# Patient Record
Sex: Female | Born: 1970
Health system: Southern US, Community
[De-identification: ages and names within clinical notes are randomized; demographics above are authoritative.]

## PROBLEM LIST (undated history)

## (undated) DIAGNOSIS — E785 Hyperlipidemia, unspecified: Secondary | ICD-10-CM

## (undated) DIAGNOSIS — F419 Anxiety disorder, unspecified: Secondary | ICD-10-CM

## (undated) DIAGNOSIS — D649 Anemia, unspecified: Secondary | ICD-10-CM

## (undated) DIAGNOSIS — T7840XA Allergy, unspecified, initial encounter: Secondary | ICD-10-CM

## (undated) HISTORY — DX: Anemia, unspecified: D64.9

## (undated) HISTORY — PX: BRAIN SURGERY: SHX531

## (undated) HISTORY — DX: Allergy, unspecified, initial encounter: T78.40XA

## (undated) HISTORY — DX: Hyperlipidemia, unspecified: E78.5

## (undated) HISTORY — PX: COLONOSCOPY: SHX174

## (undated) HISTORY — DX: Anxiety disorder, unspecified: F41.9

## (undated) HISTORY — PX: DILATION AND CURETTAGE OF UTERUS: SHX78

---

## 2014-02-07 LAB — HM COLONOSCOPY

## 2015-02-16 ENCOUNTER — Ambulatory Visit (INDEPENDENT_AMBULATORY_CARE_PROVIDER_SITE_OTHER): Payer: BLUE CROSS/BLUE SHIELD | Admitting: Podiatry

## 2015-02-16 VITALS — BP 149/91 | HR 78 | Resp 16

## 2015-02-16 DIAGNOSIS — L600 Ingrowing nail: Secondary | ICD-10-CM

## 2015-02-16 DIAGNOSIS — M79671 Pain in right foot: Secondary | ICD-10-CM | POA: Diagnosis not present

## 2015-02-16 DIAGNOSIS — L603 Nail dystrophy: Secondary | ICD-10-CM

## 2015-02-16 NOTE — Progress Notes (Signed)
   Subjective:    Patient ID: Madison Esparza, female    DOB: [birth year], 44 y.o.   MRN: [id number]  HPI  Pt presents with nail fungal infection of 1st and 4th toes right foot, possible ingrown lateral nail on 1st right hallux  Review of Systems  Psychiatric/Behavioral: The patient is nervous/anxious.   All other systems reviewed and are negative.      Objective:   Physical Exam        Assessment & Plan:

## 2015-02-16 NOTE — Patient Instructions (Addendum)

## 2015-02-19 NOTE — Progress Notes (Signed)
Subjective:     Patient ID: Madison Esparza, female   DOB: [birth year], 44 y.o.   MRN: [id number]  HPI patient presents with a painful right hallux nail lateral border that makes it hard for him to wear shoe gear comfortably and also thickness and yellow numbness of the nailbed itself   Review of Systems  All other systems reviewed and are negative.      Objective:   Physical Exam  Constitutional: She is oriented to person, place, and time.  Cardiovascular: Intact distal pulses.   Musculoskeletal: Normal range of motion.  Neurological: She is oriented to person, place, and time.  Skin: Skin is warm.  Nursing note and vitals reviewed.  neurovascular status found to be intact with muscle strength adequate range of motion within normal limits. Patient's found to have incurvated right hallux lateral border with pain and pressure and inability to wear shoe gear comfortably and yellowness of the nailbed itself with most likely trauma being a part of the condition. Good digital perfusion noted well oriented 3     Assessment:      ingrown toenail deformity right hallux with mycotic nail infection also noted    Plan:      H&P and condition reviewed with patient. At this point I recommended removal of the nail corner and a permanent fashion due to deformity and pain and patient wants procedure understanding risk. I allowed patient to read consent form reviewing condition and she's comfortable with this and I then went ahead infiltrated 60 Milligan times like Marcaine mixture applied sterile prep and then remove the lateral border exposing matrix and applying phenol 3 applications 30 seconds followed by alcohol lavage and sterile dressings. Gave instructions on soaks and reappoint and also we discussed possibilities for laser o pulse Lamisil therapy for the nail infection that the patient has but we will try topical first. Reappoint 4 months

## 2015-02-20 ENCOUNTER — Telehealth: Payer: Self-pay | Admitting: *Deleted

## 2015-02-20 NOTE — Telephone Encounter (Signed)
Left message at [phone] (Cell #) for patient to call me back regarding their ingrown toenail procedure that was performed on Friday, September, 9, 2016. Waiting for a response.

## 2015-03-09 ENCOUNTER — Ambulatory Visit (INDEPENDENT_AMBULATORY_CARE_PROVIDER_SITE_OTHER): Payer: BLUE CROSS/BLUE SHIELD | Admitting: Podiatry

## 2015-03-09 ENCOUNTER — Encounter: Payer: Self-pay | Admitting: Podiatry

## 2015-03-09 VITALS — BP 137/92 | HR 94 | Resp 16

## 2015-03-09 DIAGNOSIS — L600 Ingrowing nail: Secondary | ICD-10-CM | POA: Diagnosis not present

## 2015-03-09 DIAGNOSIS — Z9889 Other specified postprocedural states: Secondary | ICD-10-CM

## 2015-03-09 DIAGNOSIS — B351 Tinea unguium: Secondary | ICD-10-CM

## 2015-03-09 MED ORDER — TERBINAFINE HCL 250 MG PO TABS: 250.0000 mg | ORAL_TABLET | Freq: Every day | ORAL | Status: DC

## 2015-03-09 NOTE — Progress Notes (Signed)
Subjective:     Patient ID: Madison Esparza, female   DOB: 1970-07-08, 44 y.o.   MRN: [id number]  HPI patient states my nail feels much better but it is still very yellow and I like to know what we can do   Review of Systems     Objective:   Physical Exam Neurovascular status intact muscle strength adequate with thick yellow brittle nail right hallux second and fifth nailbeds    Assessment:     Doing well from ingrown toenail with mycotic nail infection    Plan:     Reviewed condition and discussed that I want to get it better it will most likely require combined treatment with no long-term guarantees. Patient wants to go this route and at this time I went ahead and start the patient on Lamisil 250 mg daily for 90 days and we will reevaluate her liver function that was just done and we will begin laser treatment of the nailbeds. Reappoint for laser

## 2015-03-12 ENCOUNTER — Telehealth: Payer: Self-pay | Admitting: *Deleted

## 2015-03-12 MED ORDER — TERBINAFINE HCL 250 MG PO TABS: 250.0000 mg | ORAL_TABLET | Freq: Every day | ORAL | Status: DC

## 2015-03-12 NOTE — Telephone Encounter (Signed)
Informed pt, she could pick up her Terbinafine without waiting for prior authorization if she allowed me to call the rx to a [facility].  Pt agreed order changed to [employer].

## 2015-03-23 ENCOUNTER — Telehealth: Payer: Self-pay | Admitting: *Deleted

## 2015-03-23 NOTE — Telephone Encounter (Signed)
Dr. Charlsie Merlesegal states Hepatic function labs were within normal limits, take medication as directed.  Left message informing pt of orders.

## 2015-03-26 ENCOUNTER — Ambulatory Visit: Payer: BLUE CROSS/BLUE SHIELD | Admitting: Podiatry

## 2015-04-04 ENCOUNTER — Encounter: Payer: Self-pay | Admitting: Podiatry

## 2015-04-04 ENCOUNTER — Ambulatory Visit (INDEPENDENT_AMBULATORY_CARE_PROVIDER_SITE_OTHER): Payer: BLUE CROSS/BLUE SHIELD | Admitting: Podiatry

## 2015-04-04 DIAGNOSIS — B351 Tinea unguium: Secondary | ICD-10-CM

## 2015-04-04 NOTE — Progress Notes (Signed)
Subjective:     Patient ID: Madison Esparza, female   DOB: 11/23/1970, 44 y.o.   MRN: [id number]  HPI patient presents for laser of the fourth fifth and first nail right   Review of Systems     Objective:   Physical Exam Neurovascular status is intact and patient is doing well with taking her Lamisil currently with no systemic effect and has nail disease of these 3 nails    Assessment:     Mycotic infection 3 nails right    Plan:     Laser administered today tolerated well and reappoint in 6 weeks to really do laser

## 2015-05-21 ENCOUNTER — Ambulatory Visit (INDEPENDENT_AMBULATORY_CARE_PROVIDER_SITE_OTHER): Payer: BLUE CROSS/BLUE SHIELD | Admitting: Podiatry

## 2015-05-21 DIAGNOSIS — B351 Tinea unguium: Secondary | ICD-10-CM

## 2015-05-22 NOTE — Progress Notes (Signed)
Subjective:     Patient ID: Madison DuboisMaria Lease, female   DOB: 12/08/1970, 44 y.o.   MRN: [id number]  HPI patient states it appears to be improving   Review of Systems     Objective:   Physical Exam Nail disease which is improving with utilization of laser therapy    Assessment:     Pulse laser therapy administered    Plan:     Tolerated well with approximate 1000 pulses and will reappoint 4 months for reevaluation

## 2015-08-27 ENCOUNTER — Ambulatory Visit: Payer: Managed Care, Other (non HMO)

## 2015-08-27 DIAGNOSIS — B351 Tinea unguium: Secondary | ICD-10-CM

## 2015-08-27 NOTE — Progress Notes (Addendum)
Subjective:     Patient ID: Madison DuboisMaria Esparza, female   DOB: 08/20/1970, 45 y.o.   MRN: [id number]  HPI patient states it appears to be improving   Review of Systems     Objective:   Physical Exam Nail disease which is improving with utilization of laser therapy    Assessment:     Pulse laser therapy administered    Plan:     Tolerated well with approximate 1000 pulses all safety precautions in place . Nails 95% clear. Advised to continue oral medication until complete, follow up if symptoms change.

## 2015-09-24 ENCOUNTER — Other Ambulatory Visit: Payer: BLUE CROSS/BLUE SHIELD

## 2015-09-27 ENCOUNTER — Other Ambulatory Visit: Payer: BLUE CROSS/BLUE SHIELD

## 2017-01-28 ENCOUNTER — Other Ambulatory Visit: Payer: Self-pay | Admitting: Obstetrics and Gynecology

## 2017-01-28 ENCOUNTER — Other Ambulatory Visit (HOSPITAL_COMMUNITY)
Admission: RE | Admit: 2017-01-28 | Discharge: 2017-01-28 | Disposition: A | Discharge status: Home / Self Care | Payer: Managed Care, Other (non HMO) | Source: Ambulatory Visit | Attending: Obstetrics and Gynecology | Admitting: Obstetrics and Gynecology

## 2017-01-28 DIAGNOSIS — Z124 Encounter for screening for malignant neoplasm of cervix: Secondary | ICD-10-CM | POA: Diagnosis not present

## 2017-01-28 DIAGNOSIS — Z1231 Encounter for screening mammogram for malignant neoplasm of breast: Secondary | ICD-10-CM

## 2017-01-30 LAB — CYTOLOGY - PAP
Diagnosis: NEGATIVE
HPV: NOT DETECTED

## 2017-02-10 ENCOUNTER — Ambulatory Visit: Payer: Self-pay

## 2017-02-13 ENCOUNTER — Ambulatory Visit: Payer: Managed Care, Other (non HMO)

## 2017-02-18 ENCOUNTER — Ambulatory Visit
Admission: RE | Admit: 2017-02-18 | Discharge: 2017-02-18 | Disposition: A | Discharge status: Home / Self Care | Payer: Managed Care, Other (non HMO) | Source: Ambulatory Visit | Attending: Obstetrics and Gynecology | Admitting: Obstetrics and Gynecology

## 2017-02-18 DIAGNOSIS — Z1231 Encounter for screening mammogram for malignant neoplasm of breast: Secondary | ICD-10-CM

## 2017-03-10 ENCOUNTER — Other Ambulatory Visit: Payer: Self-pay | Admitting: Obstetrics and Gynecology

## 2018-02-03 ENCOUNTER — Other Ambulatory Visit: Payer: Self-pay | Admitting: Obstetrics and Gynecology

## 2018-02-03 DIAGNOSIS — Z1231 Encounter for screening mammogram for malignant neoplasm of breast: Secondary | ICD-10-CM

## 2018-03-03 ENCOUNTER — Ambulatory Visit
Admission: RE | Admit: 2018-03-03 | Discharge: 2018-03-03 | Disposition: A | Discharge status: Home / Self Care | Payer: Managed Care, Other (non HMO) | Source: Ambulatory Visit | Attending: Obstetrics and Gynecology | Admitting: Obstetrics and Gynecology

## 2018-03-03 DIAGNOSIS — Z1231 Encounter for screening mammogram for malignant neoplasm of breast: Secondary | ICD-10-CM

## 2018-06-30 ENCOUNTER — Encounter: Payer: Self-pay | Admitting: Family Medicine

## 2018-06-30 ENCOUNTER — Ambulatory Visit (INDEPENDENT_AMBULATORY_CARE_PROVIDER_SITE_OTHER): Payer: Managed Care, Other (non HMO) | Admitting: Family Medicine

## 2018-06-30 VITALS — BP 110/80 | HR 74 | Temp 98.2°F | Ht 60.0 in | Wt 154.4 lb

## 2018-06-30 DIAGNOSIS — E785 Hyperlipidemia, unspecified: Secondary | ICD-10-CM | POA: Insufficient documentation

## 2018-06-30 DIAGNOSIS — Z0000 Encounter for general adult medical examination without abnormal findings: Secondary | ICD-10-CM | POA: Diagnosis not present

## 2018-06-30 DIAGNOSIS — Z23 Encounter for immunization: Secondary | ICD-10-CM | POA: Diagnosis not present

## 2018-06-30 DIAGNOSIS — Z114 Encounter for screening for human immunodeficiency virus [HIV]: Secondary | ICD-10-CM | POA: Diagnosis not present

## 2018-06-30 DIAGNOSIS — E782 Mixed hyperlipidemia: Secondary | ICD-10-CM | POA: Insufficient documentation

## 2018-06-30 LAB — COMPREHENSIVE METABOLIC PANEL
ALT: 22 U/L (ref 0–35)
AST: 25 U/L (ref 0–37)
Albumin: 4.6 g/dL (ref 3.5–5.2)
Alkaline Phosphatase: 49 U/L (ref 39–117)
BUN: 13 mg/dL (ref 6–23)
CO2: 30 mEq/L (ref 19–32)
Calcium: 10.4 mg/dL (ref 8.4–10.5)
Chloride: 99 mEq/L (ref 96–112)
Creatinine, Ser: 0.62 mg/dL (ref 0.40–1.20)
GFR: 103.57 mL/min (ref >60.00)
Glucose, Bld: 85 mg/dL (ref 70–99)
Potassium: 4.3 mEq/L (ref 3.5–5.1)
Sodium: 137 mEq/L (ref 135–145)
Total Bilirubin: 0.5 mg/dL (ref 0.2–1.2)
Total Protein: 7.6 g/dL (ref 6.0–8.3)

## 2018-06-30 LAB — CBC
HCT: 36.1 % (ref 36.0–46.0)
Hemoglobin: 11.7 g/dL — ABNORMAL LOW (ref 12.0–15.0)
MCHC: 32.4 g/dL (ref 30.0–36.0)
MCV: 73.1 fl — ABNORMAL LOW (ref 78.0–100.0)
Platelets: 343 10*3/uL (ref 150.0–400.0)
RBC: 4.95 Mil/uL (ref 3.87–5.11)
RDW: 15.4 % (ref 11.5–15.5)
WBC: 7.2 10*3/uL (ref 4.0–10.5)

## 2018-06-30 LAB — LIPID PANEL
Cholesterol: 166 mg/dL (ref 0–200)
HDL: 67 mg/dL (ref >39.00)
LDL Cholesterol: 82 mg/dL (ref 0–99)
NonHDL: 99.05
Total CHOL/HDL Ratio: 2
Triglycerides: 86 mg/dL (ref 0.0–149.0)
VLDL: 17.2 mg/dL (ref 0.0–40.0)

## 2018-06-30 LAB — TSH: TSH: 1.12 u[IU]/mL (ref 0.35–4.50)

## 2018-06-30 NOTE — Progress Notes (Signed)
Patient: Madison Esparza MRN: [id number] DOB: 11/03/1970 PCP: Orland Mustard, MD     Subjective:  Chief Complaint  Patient presents with  . Establish Care  . Annual Exam    HPI: The patient is a 48 y.o. female who presents today for annual exam. She denies any changes to past medical history. There have been no recent hospitalizations. They are following a well balanced diet and exercise plan. Weight has been stable. No complaints today. No hx of colon or breast cancer in family.   Hyperlipidemia: She is currently on crestor. She was diagnosed maybe 10 years ago. Mother has hyperlipidemia. Sister had MI at age 46 and a brother with heart disease. She was tested with stress test/etc. She has no problems with the medication. Former smoker, stopped in 2006. No hx of HTN/diabetes.   There is no immunization history for the selected administration types on file for this patient.  Colonoscopy: not until 51.  Mammogram: 02/2017 Pap smear: 01/2017. Followed by Dr. Richardson Dopp  Tdap: can not recall.  Hiv: today   Review of Systems  Constitutional: Negative for chills, fatigue and fever.  HENT: Negative for dental problem, ear pain, hearing loss and trouble swallowing.   Eyes: Negative for visual disturbance.  Respiratory: Negative for cough, chest tightness and shortness of breath.   Cardiovascular: Negative for chest pain, palpitations and leg swelling.  Gastrointestinal: Negative for abdominal pain, blood in stool, diarrhea and nausea.  Endocrine: Negative for cold intolerance, polydipsia, polyphagia and polyuria.  Genitourinary: Negative for dysuria and hematuria.  Musculoskeletal: Negative for arthralgias, back pain and neck pain.  Skin: Negative.  Negative for rash.  Neurological: Negative for dizziness and headaches.  Psychiatric/Behavioral: Positive for sleep disturbance. Negative for dysphoric mood. The patient is not nervous/anxious.     Allergies Patient is allergic to motrin  [ibuprofen].  Past Medical History Patient  has a past medical history of Anxiety and Hyperlipidemia.  Surgical History Patient  has no past surgical history on file.  Family History Pateint's family history includes Breast cancer in her cousin.  Social History Patient  reports that she has quit smoking. She has never used smokeless tobacco.    Objective: Vitals:   06/30/18 1312  BP: 110/80  Pulse: 74  Temp: 98.2 F (36.8 C)  TempSrc: Oral  SpO2: 97%  Weight: 154 lb 6.4 oz (70 kg)  Height: 5' (1.524 m)    Body mass index is 30.15 kg/m.  Physical Exam Vitals signs reviewed.  Constitutional:      Appearance: She is well-developed.  HENT:     Right Ear: Tympanic membrane, ear canal and external ear normal.     Left Ear: Tympanic membrane, ear canal and external ear normal.  Eyes:     Conjunctiva/sclera: Conjunctivae normal.     Pupils: Pupils are equal, round, and reactive to light.  Neck:     Musculoskeletal: Normal range of motion and neck supple.     Thyroid: No thyromegaly.  Cardiovascular:     Rate and Rhythm: Normal rate and regular rhythm.     Heart sounds: Normal heart sounds. No murmur.  Pulmonary:     Effort: Pulmonary effort is normal.     Breath sounds: Normal breath sounds.  Abdominal:     General: Bowel sounds are normal. There is no distension.     Palpations: Abdomen is soft.     Tenderness: There is no abdominal tenderness.  Lymphadenopathy:     Cervical: No cervical adenopathy.  Skin:  General: Skin is warm and dry.     Findings: No rash.  Neurological:     Mental Status: She is alert and oriented to person, place, and time.     Cranial Nerves: No cranial nerve deficit.     Coordination: Coordination normal.     Deep Tendon Reflexes: Reflexes normal.  Psychiatric:        Behavior: Behavior normal.    Depression screen [facility] 2/9 06/30/2018  Decreased Interest 0  Down, Depressed, Hopeless 0  PHQ - 2 Score 0   GAD 7 : Generalized  Anxiety Score 06/30/2018  Nervous, Anxious, on Edge 1  Control/stop worrying 1  Worry too much - different things 1  Trouble relaxing 1  Restless 0  Easily annoyed or irritable 3  Afraid - awful might happen 1  Total GAD 7 Score 8  Anxiety Difficulty Not difficult at all        Assessment/plan: 1. Annual physical exam Doing well. Checking labs today and tdap/ utd on her pap/mmg. Declines flu. Will see her back in one year or as needed.  Patient counseling [x]    Nutrition: Stressed importance of moderation in sodium/caffeine intake, saturated fat and cholesterol, caloric balance, sufficient intake of fresh fruits, vegetables, fiber, calcium, iron, and 1 mg of folate supplement per day (for females capable of pregnancy).  [x]    Stressed the importance of regular exercise.   []    Substance Abuse: Discussed cessation/primary prevention of tobacco, alcohol, or other drug use; driving or other dangerous activities under the influence; availability of treatment for abuse.   [x]    Injury prevention: Discussed safety belts, safety helmets, smoke detector, smoking near bedding or upholstery.   [x]    Sexuality: Discussed sexually transmitted diseases, partner selection, use of condoms, avoidance of unintended pregnancy  and contraceptive alternatives.  [x]    Dental health: Discussed importance of regular tooth brushing, flossing, and dental visits.  [x]    Health maintenance and immunizations reviewed. Please refer to Health maintenance section.    - Comprehensive metabolic panel - CBC - TSH  2. Mixed hyperlipidemia Checking labs today. No refills needed. Tolerating medication well. Early MI in sister at age 79, but was smoker on OCP. She was stressed with negative work up at that time. Continue medication.  - Lipid panel  3. Need for Tdap vaccination  - Tdap vaccine greater than or equal to 7yo IM  4. Encounter for screening for HIV  - HIV Antibody (routine testing w  rflx)      Return in about 1 year (around 07/01/2019).     Orland Mustard, MD [place] [facility]  06/30/2018

## 2018-06-30 NOTE — Patient Instructions (Signed)
ashwagandha is great for sleep. 300mg  at night. Can get off [facility] or at [employer]

## 2018-07-01 LAB — HIV ANTIBODY (ROUTINE TESTING W REFLEX): HIV 1&2 Ab, 4th Generation: NONREACTIVE

## 2018-07-02 ENCOUNTER — Other Ambulatory Visit: Payer: Self-pay | Admitting: Family Medicine

## 2018-07-02 DIAGNOSIS — D508 Other iron deficiency anemias: Secondary | ICD-10-CM

## 2018-07-05 ENCOUNTER — Other Ambulatory Visit: Payer: Self-pay | Admitting: Family Medicine

## 2018-07-05 DIAGNOSIS — D509 Iron deficiency anemia, unspecified: Secondary | ICD-10-CM | POA: Insufficient documentation

## 2018-07-05 MED ORDER — FLUTICASONE PROPIONATE 50 MCG/ACT NA SUSP: 1.0000 | Freq: Every day | NASAL | 3 refills | Status: DC

## 2018-08-16 ENCOUNTER — Other Ambulatory Visit: Payer: Self-pay

## 2018-08-16 ENCOUNTER — Ambulatory Visit: Payer: Managed Care, Other (non HMO) | Attending: Orthopedic Surgery

## 2018-08-16 DIAGNOSIS — M79644 Pain in right finger(s): Secondary | ICD-10-CM | POA: Diagnosis present

## 2018-08-16 DIAGNOSIS — M25521 Pain in right elbow: Secondary | ICD-10-CM | POA: Diagnosis not present

## 2018-08-16 DIAGNOSIS — G8929 Other chronic pain: Secondary | ICD-10-CM | POA: Diagnosis present

## 2018-08-16 NOTE — Therapy (Signed)
[facility] [address] [place], [place], [address] Phone: [phone]   Fax:  [phone]  Physical Therapy Evaluation  Patient Details  Name: Madison Esparza MRN: [id number] Date of Birth: 09-06-1970 Referring Provider (PT): Frederico Hamman, MD   Encounter Date: 08/16/2018  PT End of Session - 08/16/18 0920    Visit Number  1    Number of Visits  12    Date for PT Re-Evaluation  09/24/18    Authorization Type  [employer]    PT Start Time  0920    PT Stop Time  1000    PT Time Calculation (min)  40 min    Activity Tolerance  Patient tolerated treatment well    Behavior During Therapy  [facility] for tasks assessed/performed       Past Medical History:  Diagnosis Date  . Allergy   . Anxiety   . Hyperlipidemia     No past surgical history on file.  There were no vitals filed for this visit.   Subjective Assessment - 08/16/18 0924    Subjective  RT elbow pain.  She has had this in past. Cortisone injection in past helped but this time wrist and thumb  pain is present and  work as Producer, television/film/video causes incr pain.   Trying to avoid another injection.  Prednisone has helped.  Continues with pain.     Pertinent History  Gym exercise.  Bicep curls , shoulder press, TRX rows   3-4 years  of elbow pain    Limitations  --   work  tasks , gym lifting dumb bells up to 20 pounds      Diagnostic tests  Xrays : negative    Patient Stated Goals  She wants to decr pain.     Currently in Pain?  Yes    Pain Score  2     Pain Location  Elbow    Pain Orientation  Right;Lateral    Pain Descriptors / Indicators  Throbbing    Pain Type  Chronic pain    Pain Onset  More than a month ago    Pain Frequency  Constant    Aggravating Factors   gripping , work , gym     Pain Relieving Factors  ice , cream.     Multiple Pain Sites  Yes    Pain Score  1    Pain Location  --   thumb    Pain Orientation  Right    Pain Descriptors / Indicators  Aching    locks   Pain Type  Chronic pain    Pain Onset  More than a month ago    Pain Frequency  Intermittent    Aggravating Factors   gripping , work    Pain Relieving Factors  cream         [facility] PT Assessment - 08/16/18 0001      Assessment   Medical Diagnosis  RT lateral epicondylitis    Referring Provider (PT)  Frederico Hamman, MD    Onset Date/Surgical Date  --   4-5 years ago   Hand Dominance  Right    Next MD Visit  As needed    Prior Therapy  Yes . Helped but not long term      Precautions   Precautions  None      Restrictions   Weight Bearing Restrictions  No      Balance Screen   Has the patient fallen  in the past 6 months  No      Prior Function   Level of Independence  Independent    Vocation  Full time employment    Vocation Requirements  hair dresser      Cognition   Overall Cognitive Status  Within Functional Limits for tasks assessed      Observation/Other Assessments   Focus on Therapeutic Outcomes (FOTO)   41% limited      ROM / Strength   AROM / PROM / Strength  AROM;Strength                Objective measurements completed on examination: See above findings.      [facility] Adult PT Treatment/Exercise - 08/16/18 0001      Self-Care   Self-Care  Other Self-Care Comments    Other Self-Care Comments   resting splint for RT thumb,  stop or decrease gripping with exer at gym.        Exercises   Exercises  Wrist      Wrist Exercises   Wrist Flexion  Right    Wrist Flexion Limitations  3 reps 30 sec      Modalities   Modalities  Iontophoresis      Iontophoresis   Type of Iontophoresis  Dexamethasone    Location  RT lateral elbow    Dose  1cc    Time  4-6 hours             PT Education - 08/16/18 1009    Education Details  poc .  HEP    Person(s) Educated  Patient    Methods  Explanation;Demonstration;Tactile cues;Handout;Verbal cues    Comprehension  Returned demonstration;Verbalized understanding       PT Short Term  Goals - 08/16/18 1013      PT SHORT TERM GOAL #1   Title  She wiii be independent with initial HEP     Time  3    Period  Weeks    Status  New      PT SHORT TERM GOAL #2   Title  She will report pain decr at gym with modifications    Time  3    Period  Weeks    Status  New        PT Long Term Goals - 08/16/18 1014      PT LONG TERM GOAL #1   Title  She will be independent with all HEP issued    Time  6    Period  Weeks    Status  New      PT LONG TERM GOAL #2   Title  She will report pain decreased 50% for work tasks    Time  6    Period  Weeks    Status  New      PT LONG TERM GOAL #3   Title  She will report RT thumb pain decr 50% or more     Time  6    Period  Weeks    Status  New      PT LONG TERM GOAL #4   Title  She will report pain as intermittant in RT elbow.    Time  6    Period  Weeks    Status  New             Plan - 08/16/18 5208    Clinical Impression Statement  Ms Tallmadge presents with chronic elbow and thi\umb pain . Her  ROM is normal and strength is normal. She has pain lateral RT elbow into extenensor tissue and base of RT thum. She should im;prove with skilled PT .     Personal Factors and Comorbidities  Time since onset of injury/illness/exacerbation    Examination-Activity Limitations  Lift;Caring for Others    Examination-Participation Restrictions  Other   work and gym   Stability/Clinical Decision Making  Stable/Uncomplicated    Rehab Potential  Good    PT Frequency  2x / week    PT Duration  6 weeks    PT Treatment/Interventions  Passive range of motion;Manual techniques;Patient/family education;Therapeutic exercise;Moist Heat;Cryotherapy;Ultrasound;Iontophoresis 4mg /ml Dexamethasone;Taping    PT Next Visit Plan  REview HEP , add   Assess ionto,   [place] and manua; for pain.  Assess grip strength     PT Home Exercise Plan  wrist flexion stretch    Consulted and Agree with Plan of Care  Patient       Patient will benefit from skilled  therapeutic intervention in order to improve the following deficits and impairments:  Impaired UE functional use, Pain  Visit Diagnosis: Pain in right elbow - Plan: PT plan of care cert/re-cert  Chronic pain of right thumb - Plan: PT plan of care cert/re-cert     Problem List Patient Active Problem List   Diagnosis Date Noted  . Iron deficiency anemia 07/05/2018  . Hyperlipidemia 06/30/2018    Caprice Red  PT 08/16/2018, 10:20 AM  [facility] [address] [place], [place], [address] Phone: [phone]   Fax:  [phone]  Name: Madison Esparza MRN: [id number] Date of Birth: 1970-11-18

## 2018-08-16 NOTE — Patient Instructions (Signed)

## 2018-08-25 ENCOUNTER — Encounter: Payer: Self-pay | Admitting: Family Medicine

## 2018-08-25 ENCOUNTER — Encounter: Payer: Self-pay | Admitting: Physical Therapy

## 2018-08-25 ENCOUNTER — Ambulatory Visit: Payer: Managed Care, Other (non HMO) | Admitting: Physical Therapy

## 2018-08-25 ENCOUNTER — Other Ambulatory Visit: Payer: Self-pay

## 2018-08-25 DIAGNOSIS — G8929 Other chronic pain: Secondary | ICD-10-CM

## 2018-08-25 DIAGNOSIS — M25521 Pain in right elbow: Secondary | ICD-10-CM

## 2018-08-25 DIAGNOSIS — M7711 Lateral epicondylitis, right elbow: Secondary | ICD-10-CM | POA: Insufficient documentation

## 2018-08-25 DIAGNOSIS — M79644 Pain in right finger(s): Secondary | ICD-10-CM

## 2018-08-25 NOTE — Therapy (Addendum)
[place], [place], [address] Phone: [phone]   Fax:  [phone]  Physical Therapy Treatment/Discharge  Patient Details  Name: Madison Esparza MRN: [id number] Date of Birth: 10-Jul-1970 Referring Provider (PT): Earlie Server, MD   Encounter Date: 08/25/2018  PT End of Session - 08/25/18 1150    Visit Number  2    Number of Visits  12    Date for PT Re-Evaluation  09/24/18    Authorization Type  [employer]a    PT Start Time  1150    PT Stop Time  1227    PT Time Calculation (min)  37 min    Activity Tolerance  Patient tolerated treatment well       Past Medical History:  Diagnosis Date  . Allergy   . Anxiety   . Hyperlipidemia     History reviewed. No pertinent surgical history.  There were no vitals filed for this visit.  Subjective Assessment - 08/25/18 1150    Subjective  Pt reports she did well with the patch - had a small area from it however it went a way quickly. Doing the stretch a lot.     Patient Stated Goals  She wants to decr pain.     Currently in Pain?  Yes    Pain Score  3     Pain Location  --   thumb   Pain Orientation  Right    Pain Descriptors / Indicators  Dull;Aching    Pain Type  Chronic pain    Pain Onset  More than a month ago    Pain Frequency  Constant    Aggravating Factors   work - as a Emergency planning/management officer    Pain Relieving Factors  ice and deep blue                       [facility] Adult PT Treatment/Exercise - 08/25/18 0001      Wrist Exercises   Wrist Flexion  Right    Wrist Flexion Limitations  stretching    Other wrist exercises  self cross friction massage return demo      Modalities   Modalities  Iontophoresis;Ultrasound      Ultrasound   Ultrasound Location  Rt forearm extensor mass    Ultrasound Parameters  100%, 1.0 MHz, 1.4 w/cm2    Ultrasound Goals  Pain;Other (Comment)   tone     Iontophoresis   Type of Iontophoresis  Dexamethasone    Location  RT lateral elbow    Dose  1cc    Time  4-6 hours      Manual Therapy   Manual Therapy  Soft tissue mobilization;Other (comment)    Soft tissue mobilization  cross friction massage to Rt forearm extensor mass/IASTM, also to Rt thenar emmience    Other Manual Therapy  ice massage to forearm following cross friction and stretches.              PT Education - 08/25/18 1234    Education Details  cross friction massage, ice massage and verbal k-tape application to assist wrist extension    Person(s) Educated  Patient    Methods  Explanation;Demonstration;Verbal cues    Comprehension  Verbalized understanding;Returned demonstration       PT Short Term Goals - 08/16/18 1013      PT SHORT TERM GOAL #1   Title  She wiii be independent with initial HEP  Time  3    Period  Weeks    Status  New      PT SHORT TERM GOAL #2   Title  She will report pain decr at gym with modifications    Time  3    Period  Weeks    Status  New        PT Long Term Goals - 08/16/18 1014      PT LONG TERM GOAL #1   Title  She will be independent with all HEP issued    Time  6    Period  Weeks    Status  New      PT LONG TERM GOAL #2   Title  She will report pain decreased 50% for work tasks    Time  6    Period  Weeks    Status  New      PT LONG TERM GOAL #3   Title  She will report RT thumb pain decr 50% or more     Time  6    Period  Weeks    Status  New      PT LONG TERM GOAL #4   Title  She will report pain as intermittant in RT elbow.    Time  6    Period  Weeks    Status  New            Plan - 08/25/18 1232    Clinical Impression Statement  This is Madison Esparza's second visit, she has been doing her stretches at home and using ice.  Still having pain in her forearm due to her work.  She did well with tx today, less palpable tightness in the forearm and pt reported less pain.      Rehab Potential  Good    PT Frequency  2x / week    PT Duration  6 weeks    PT  Treatment/Interventions  Passive range of motion;Manual techniques;Patient/family education;Therapeutic exercise;Moist Heat;Cryotherapy;Ultrasound;Iontophoresis 35m/ml Dexamethasone;Taping    PT Next Visit Plan  US/manual/ionto, add in eccentric wrist work.     Consulted and Agree with Plan of Care  Patient       Patient will benefit from skilled therapeutic intervention in order to improve the following deficits and impairments:  Impaired UE functional use, Pain  Visit Diagnosis: Pain in right elbow  Chronic pain of right thumb     Problem List Patient Active Problem List   Diagnosis Date Noted  . Right lateral epicondylitis 08/25/2018  . Iron deficiency anemia 07/05/2018  . Hyperlipidemia 06/30/2018    SJeral PinchPT  08/25/2018, 12:35 PM  [facility] [address]Phone: [phone]  Fax:  [phone] Name: Madison BUECHNERMRN: [id number] of Birth: [birth year]  PHYSICAL THERAPY DISCHARGE SUMMARY  Visits from Start of Care: 2  Current functional level related to goals / functional outcomes: unknown   Remaining deficits: unknown   Education / Equipment: HEP Plan:                                                    Patient goals were not met. Patient is being discharged due to not returning since the last visit.  Message left for pt to call after reopening /covid.  No call has been received ?????    Jeral Pinch, PT 02/01/19 12:14 PM

## 2018-08-30 ENCOUNTER — Ambulatory Visit: Payer: Managed Care, Other (non HMO) | Admitting: Physical Therapy

## 2018-09-01 ENCOUNTER — Ambulatory Visit: Payer: Managed Care, Other (non HMO) | Admitting: Physical Therapy

## 2018-09-06 ENCOUNTER — Ambulatory Visit: Payer: Managed Care, Other (non HMO)

## 2018-09-08 ENCOUNTER — Ambulatory Visit: Payer: Managed Care, Other (non HMO) | Admitting: Physical Therapy

## 2018-09-13 ENCOUNTER — Ambulatory Visit: Payer: Managed Care, Other (non HMO)

## 2018-09-15 ENCOUNTER — Ambulatory Visit: Payer: Managed Care, Other (non HMO)

## 2018-09-28 ENCOUNTER — Telehealth: Payer: Self-pay | Admitting: Physical Therapy

## 2018-09-28 NOTE — Telephone Encounter (Signed)
Message left for tele health option and that the clinic would be closed until June 1st and we would contact patients before that time to  schedule visits for after the first of June

## 2018-10-12 ENCOUNTER — Telehealth: Payer: Self-pay | Admitting: Physical Therapy

## 2018-10-12 NOTE — Telephone Encounter (Signed)
Left message requesting call back to determine d/c vs continuing in clinic.  Ayren Zumbro C. Wendelin Reader PT, DPT 10/12/18 9:38 AM

## 2019-01-04 ENCOUNTER — Other Ambulatory Visit: Payer: Self-pay

## 2019-01-04 ENCOUNTER — Ambulatory Visit (INDEPENDENT_AMBULATORY_CARE_PROVIDER_SITE_OTHER): Payer: Managed Care, Other (non HMO) | Admitting: Family Medicine

## 2019-01-04 ENCOUNTER — Encounter: Payer: Self-pay | Admitting: Family Medicine

## 2019-01-04 VITALS — Temp 98.1°F

## 2019-01-04 DIAGNOSIS — Z20822 Contact with and (suspected) exposure to covid-19: Secondary | ICD-10-CM

## 2019-01-04 DIAGNOSIS — Z20828 Contact with and (suspected) exposure to other viral communicable diseases: Secondary | ICD-10-CM

## 2019-01-04 NOTE — Progress Notes (Signed)
Virtual Visit via Video Note  I connected with Madison Esparza  on 01/04/19 at 12:20 PM EDT by a video enabled telemedicine application and verified that I am speaking with the correct person using two identifiers.  Location patient: home Location provider:work or home office Persons participating in the virtual visit: patient, provider  I discussed the limitations of evaluation and management by telemedicine and the availability of in person appointments. The patient expressed understanding and agreed to proceed.   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  Past Medical History:  Diagnosis Date  . Allergy   . Anxiety   . Hyperlipidemia     History reviewed. No pertinent surgical history.  Family History  Problem Relation Age of Onset  . Breast cancer Cousin   . Arthritis Mother   . Hyperlipidemia Mother   . Miscarriages / [place] Mother   . Arthritis Sister   . Heart attack Sister   . Heart disease Sister   . Heart disease Brother   . Arthritis Maternal Grandmother   . Hearing loss Maternal Grandmother   . Hypertension Maternal Grandmother   . Hearing loss Maternal Grandfather     SOCIAL HX: see hpi   Current Outpatient Medications:  .  b complex vitamins capsule, Take 1 capsule by mouth daily., Disp: , Rfl:  .  Ferrous Sulfate  (IRON PO), Take by mouth., Disp: , Rfl:  .  fluticasone (FLONASE) 50 MCG/ACT nasal spray, Place 1 spray into both nostrils daily., Disp: 16 g, Rfl: 3 .  MAGNESIUM PO, Take by mouth., Disp: , Rfl:  .  rosuvastatin (CRESTOR) 10 MG tablet, Take 10 mg by mouth daily., Disp: , Rfl:  .  TURMERIC PO, Take by mouth., Disp: , Rfl:   EXAM:  VITALS per patient if applicable:  GENERAL: alert, oriented, appears well and in no acute distress  HEENT: atraumatic, conjunttiva clear, no obvious abnormalities on inspection of external nose and ears  NECK: normal movements of the head and neck  LUNGS: on inspection no signs of respiratory distress, breathing rate appears normal, no obvious gross SOB, gasping or wheezing  CV: no obvious cyanosis  MS: moves all visible extremities without noticeable abnormality  PSYCH/NEURO: pleasant and cooperative, no obvious depression or anxiety, speech and thought processing grossly intact  ASSESSMENT AND PLAN: More than 50% of over 25 minutes spent in total in caring for this patient was spent  counseling and/or coordinating care.   Discussed the following assessment and plan:  Exposure to 2019 Novel Coronavirus - Plan:Novel Coronavirus, NAA ([facility])  Close exposure to known positive. She is asymptomatic. Advised home/self quarantine for 14 days. Discussed signs, symptoms, potential risks, precautions, testing, limitations and advised follow up as needed. Test ordered and instructions provided.  I discussed the assessment and treatment plan with the patient. The  patient was provided an opportunity to ask questions and all were answered. The patient agreed with the plan and demonstrated an understanding of the instructions.   The patient was advised to call back or seek an in-person evaluation if the symptoms worsen or if the condition fails to improve as anticipated.   Lucretia Kern, DO   Patient Instructions  Follow up: if symptoms, concerns or positive  test   Self Isolation/Home Quarantine: -see the CDC site for information:   [email or web address].za.html   -STAY HOME except for to seek medical care -stay in your own room away from others in your house and use a separate bathroom if possible -Wash hands frequently, wear a mask if you leave your room and interact as little as possible with others -seek medical care immediately if worsening - call our office for a visit or call ahead if going elsewhere to an urgent care  -seek emergency care if very sick or severe symptoms - call 911 -isolate for at least 14 days from the onset exposure or 10 days from the onset of symptoms PLUS 3 days of no fever PLUS 3 days of improving symptoms    Novel Coronavirus Testing: I sent an order for coronavirus testing. No Appointment is needed.  Testing Sites:    [facility] Location:                            [address], [place] ([facility]) Hours:                                 8a-3:45p, M-F  [facility] Location:                           [address], [place], [place] [address]                                                              [place] ([place]) Hours:                                 8a-3:45p, M-F  [facility] Location:                            [address], [address] (across from [place]) Hours:                                 8a-3:45p, M-F  Positive test. These tests are not 100% perfect, but if you tested positive for COVID-19, this confirms that you have contracted the SARS-CoV-2 virus. STAY HOME to complete full Quarantine per CDC guidelines.  Negative test. These tests are not 100% perfect but if you tested negative  for COVID-19, this indicates that you may not have contracted the SARS-CoV-2 virus. Follow your doctor's recommendations and the CDC  guidelines.

## 2019-01-04 NOTE — Patient Instructions (Signed)
Follow up: if symptoms, concerns or positive test   Self Isolation/Home Quarantine: -see the CDC site for information:   [email or web address].co.za.html   -STAY HOME except for to seek medical care -stay in your own room away from others in your house and use a separate bathroom if possible -Wash hands frequently, wear a mask if you leave your room and interact as little as possible with others -seek medical care immediately if worsening - call our office for a visit or call ahead if going elsewhere to an urgent care  -seek emergency care if very sick or severe symptoms - call 911 -isolate for at least 14 days from the onset exposure or 10 days from the onset of symptoms PLUS 3 days of no fever PLUS 3 days of improving symptoms    Novel Coronavirus Testing: I sent an order for coronavirus testing. No Appointment is needed.  Testing Sites:    [facility] Location:                            [address] [place] (old [facility]) Hours:                                 8a-3:45p, M-F  [facility] Location:                           [address], [place], [place] [address]                                                              [place] ([place]) Hours:                                 8a-3:45p, M-F  [facility] Location:                            [address], [address] (across from [place]) Hours:                                 8a-3:45p, M-F  Positive test. These tests are not 100% perfect, but if you tested positive for COVID-19, this confirms that you have contracted the SARS-CoV-2 virus. STAY HOME to complete full Quarantine per CDC guidelines.  Negative test. These tests are not 100% perfect but if you tested negative for COVID-19, this indicates that you may not have contracted the SARS-CoV-2 virus. Follow your  doctor's recommendations and the CDC guidelines.

## 2019-01-06 LAB — NOVEL CORONAVIRUS, NAA: SARS-CoV-2, NAA: NOT DETECTED

## 2019-04-04 ENCOUNTER — Other Ambulatory Visit: Payer: Self-pay | Admitting: Obstetrics and Gynecology

## 2019-04-04 DIAGNOSIS — Z1231 Encounter for screening mammogram for malignant neoplasm of breast: Secondary | ICD-10-CM

## 2019-05-24 ENCOUNTER — Ambulatory Visit
Admission: RE | Admit: 2019-05-24 | Discharge: 2019-05-24 | Disposition: A | Discharge status: Home / Self Care | Payer: Managed Care, Other (non HMO) | Source: Ambulatory Visit | Attending: Obstetrics and Gynecology | Admitting: Obstetrics and Gynecology

## 2019-05-24 ENCOUNTER — Other Ambulatory Visit: Payer: Self-pay

## 2019-05-24 DIAGNOSIS — Z1231 Encounter for screening mammogram for malignant neoplasm of breast: Secondary | ICD-10-CM

## 2019-06-14 ENCOUNTER — Encounter: Payer: Self-pay | Admitting: Family Medicine

## 2019-06-16 ENCOUNTER — Other Ambulatory Visit: Payer: Self-pay

## 2019-06-16 MED ORDER — ROSUVASTATIN CALCIUM 10 MG PO TABS: 10.0000 mg | ORAL_TABLET | Freq: Every day | ORAL | 1 refills | Status: DC

## 2019-06-28 ENCOUNTER — Other Ambulatory Visit: Payer: Self-pay

## 2019-06-29 ENCOUNTER — Encounter: Payer: Managed Care, Other (non HMO) | Admitting: Family Medicine

## 2019-07-19 ENCOUNTER — Other Ambulatory Visit: Payer: Self-pay | Admitting: Family Medicine

## 2019-08-10 ENCOUNTER — Ambulatory Visit (INDEPENDENT_AMBULATORY_CARE_PROVIDER_SITE_OTHER): Payer: Managed Care, Other (non HMO) | Admitting: Family Medicine

## 2019-08-10 ENCOUNTER — Encounter: Payer: Self-pay | Admitting: Family Medicine

## 2019-08-10 ENCOUNTER — Other Ambulatory Visit: Payer: Self-pay

## 2019-08-10 VITALS — BP 134/74 | HR 90 | Temp 98.1°F | Ht 60.0 in | Wt 154.2 lb

## 2019-08-10 DIAGNOSIS — R1011 Right upper quadrant pain: Secondary | ICD-10-CM

## 2019-08-10 DIAGNOSIS — Z0000 Encounter for general adult medical examination without abnormal findings: Secondary | ICD-10-CM | POA: Diagnosis not present

## 2019-08-10 DIAGNOSIS — E782 Mixed hyperlipidemia: Secondary | ICD-10-CM | POA: Diagnosis not present

## 2019-08-10 DIAGNOSIS — K449 Diaphragmatic hernia without obstruction or gangrene: Secondary | ICD-10-CM | POA: Insufficient documentation

## 2019-08-10 DIAGNOSIS — D508 Other iron deficiency anemias: Secondary | ICD-10-CM | POA: Diagnosis not present

## 2019-08-10 DIAGNOSIS — R1013 Epigastric pain: Secondary | ICD-10-CM

## 2019-08-10 NOTE — Patient Instructions (Signed)
-come back for labs in 2 weeks and we will check lots of things.  -ultrasound of your stomach ordered to check gallbladder -after I get labs back will send in prescription for protonix, If not better will send to GI.    So good to see you!  Dr. Rogers Blocker    Preventive Care 48-48 Years Old, Female Preventive care refers to visits with your health care provider and lifestyle choices that can promote health and wellness. This includes:  A yearly physical exam. This may also be called an annual well check.  Regular dental visits and eye exams.  Immunizations.  Screening for certain conditions.  Healthy lifestyle choices, such as eating a healthy diet, getting regular exercise, not using drugs or products that contain nicotine and tobacco, and limiting alcohol use. What can I expect for my preventive care visit? Physical exam Your health care provider will check your:  Height and weight. This may be used to calculate body mass index (BMI), which tells if you are at a healthy weight.  Heart rate and blood pressure.  Skin for abnormal spots. Counseling Your health care provider may ask you questions about your:  Alcohol, tobacco, and drug use.  Emotional well-being.  Home and relationship well-being.  Sexual activity.  Eating habits.  Work and work Statistician.  Method of birth control.  Menstrual cycle.  Pregnancy history. What immunizations do I need?  Influenza (flu) vaccine  This is recommended every year. Tetanus, diphtheria, and pertussis (Tdap) vaccine  You may need a Td booster every 10 years. Varicella (chickenpox) vaccine  You may need this if you have not been vaccinated. Zoster (shingles) vaccine  You may need this after age 48. Measles, mumps, and rubella (MMR) vaccine  You may need at least one dose of MMR if you were born in 1957 or later. You may also need a second dose. Pneumococcal conjugate (PCV13) vaccine  You may need this if you have  certain conditions and were not previously vaccinated. Pneumococcal polysaccharide (PPSV23) vaccine  You may need one or two doses if you smoke cigarettes or if you have certain conditions. Meningococcal conjugate (MenACWY) vaccine  You may need this if you have certain conditions. Hepatitis A vaccine  You may need this if you have certain conditions or if you travel or work in places where you may be exposed to hepatitis A. Hepatitis B vaccine  You may need this if you have certain conditions or if you travel or work in places where you may be exposed to hepatitis B. Haemophilus influenzae type b (Hib) vaccine  You may need this if you have certain conditions. Human papillomavirus (HPV) vaccine  If recommended by your health care provider, you may need three doses over 6 months. You may receive vaccines as individual doses or as more than one vaccine together in one shot (combination vaccines). Talk with your health care provider about the risks and benefits of combination vaccines. What tests do I need? Blood tests  Lipid and cholesterol levels. These may be checked every 5 years, or more frequently if you are over 48 years old.  Hepatitis C test.  Hepatitis B test. Screening  Lung cancer screening. You may have this screening every year starting at age 48 if you have a 30-pack-year history of smoking and currently smoke or have quit within the past 15 years.  Colorectal cancer screening. All adults should have this screening starting at age 48 and continuing until age 48. Your health care  provider may recommend screening at age 48 if you are at increased risk. You will have tests every 1-10 years, depending on your results and the type of screening test.  Diabetes screening. This is done by checking your blood sugar (glucose) after you have not eaten for a while (fasting). You may have this done every 1-3 years.  Mammogram. This may be done every 1-2 years. Talk with your  health care provider about when you should start having regular mammograms. This may depend on whether you have a family history of breast cancer.  BRCA-related cancer screening. This may be done if you have a family history of breast, ovarian, tubal, or peritoneal cancers.  Pelvic exam and Pap test. This may be done every 3 years starting at age 48. Starting at age 48, this may be done every 5 years if you have a Pap test in combination with an HPV test. Other tests  Sexually transmitted disease (STD) testing.  Bone density scan. This is done to screen for osteoporosis. You may have this scan if you are at high risk for osteoporosis. Follow these instructions at home: Eating and drinking  Eat a diet that includes fresh fruits and vegetables, whole grains, lean protein, and low-fat dairy.  Take vitamin and mineral supplements as recommended by your health care provider.  Do not drink alcohol if: ? Your health care provider tells you not to drink. ? You are pregnant, may be pregnant, or are planning to become pregnant.  If you drink alcohol: ? Limit how much you have to 0-1 drink a day. ? Be aware of how much alcohol is in your drink. In the [place] one drink equals one 12 oz bottle of beer (355 mL), one 5 oz glass of wine (148 mL), or one 1 oz glass of hard liquor (44 mL). Lifestyle  Take daily care of your teeth and gums.  Stay active. Exercise for at least 30 minutes on 5 or more days each week.  Do not use any products that contain nicotine or tobacco, such as cigarettes, e-cigarettes, and chewing tobacco. If you need help quitting, ask your health care provider.  If you are sexually active, practice safe sex. Use a condom or other form of birth control (contraception) in order to prevent pregnancy and STIs (sexually transmitted infections).  If told by your health care provider, take low-dose aspirin daily starting at age 48. What's next?  Visit your health care provider once  a year for a well check visit.  Ask your health care provider how often you should have your eyes and teeth checked.  Stay up to date on all vaccines. This information is not intended to replace advice given to you by your health care provider. Make sure you discuss any questions you have with your health care provider. Document Revised: 02/04/2018 Document Reviewed: 02/04/2018 Elsevier Patient Education  2020 [employer].

## 2019-08-10 NOTE — Progress Notes (Signed)
Patient: Madison Esparza MRN: [id number] DOB: 04-29-1971 PCP: Orland Mustard, MD     Subjective:  Chief Complaint  Patient presents with  . Annual Exam  . Gastroesophageal Reflux  . Hiatal Hernia  . Hyperlipidemia  . Iron Deficiency    HPI: The patient is a 49 y.o. female who presents today for annual exam. She denies any changes to past medical history. There have been no recent hospitalizations. They are following a well balanced diet and exercise plan. Weight has been stable.   Hyperlipidemia: currently on crestor 10mg . Has been well controlled in the past. She is exercising. She was on the keto diet.   Iron deficiency anemia: has iron supplement, but forgets to take somedays. Has a family history of iron deficiency.   Hiatal hernia/GERD/RUQ pain epigastric fullness: she feels like her reflux is getting worse. She feels like all foods are causing this, especially blueberries/pineapple, raw carrots. She has 2 cups of coffee in the AM and wine every night. No NSAIDs, no chocolate, no smoking. She has lost weight The nexium does help when she takes it, but she doesn't take it regularly. She gets more of stomach pain with these foods, not burning up her chest. No N/V/D. She also has pain in her RUQ. No international travel. Well water. Denies any fever/chills.   Immunization History  Administered Date(s) Administered  . Tdap 06/30/2018   Colonoscopy: 2016 Mammogram: 05/2019 Pap smear: 01/2017  Review of Systems  Constitutional: Negative for chills, fatigue and fever.  HENT: Negative for sinus pressure, sinus pain and sore throat.   Respiratory: Negative for cough, shortness of breath and wheezing.   Cardiovascular: Negative for chest pain and palpitations.  Gastrointestinal: Negative for abdominal pain, nausea and vomiting.  Genitourinary: Negative for frequency, pelvic pain and urgency.  Neurological: Negative for dizziness, weakness and light-headedness.    Allergies Patient  is allergic to motrin [ibuprofen].  Past Medical History Patient  has a past medical history of Allergy, Anxiety, and Hyperlipidemia.  Surgical History Patient  has no past surgical history on file.  Family History Pateint's family history includes Arthritis in her maternal grandmother, mother, and sister; Breast cancer in her cousin; Hearing loss in her maternal grandfather and maternal grandmother; Heart attack in her sister; Heart disease in her brother and sister; Hyperlipidemia in her mother; Hypertension in her maternal grandmother; Miscarriages / 02/2017 in her mother.  Social History Patient  reports that she has quit smoking. Her smoking use included cigarettes. She has never used smokeless tobacco. She reports current alcohol use. She reports that she does not use drugs.    Objective: Vitals:   08/10/19 1443  BP: 134/74  Pulse: 90  Temp: 98.1 F (36.7 C)  TempSrc: Temporal  SpO2: 97%  Weight: 154 lb 3.2 oz (69.9 kg)  Height: 5' (1.524 m)    Body mass index is 30.12 kg/m.  Physical Exam Vitals reviewed.  Constitutional:      Appearance: Normal appearance. She is well-developed. She is obese.  HENT:     Head: Normocephalic and atraumatic.     Right Ear: Tympanic membrane, ear canal and external ear normal.     Left Ear: Tympanic membrane and external ear normal.     Nose: Nose normal.     Mouth/Throat:     Mouth: Mucous membranes are moist.  Eyes:     Extraocular Movements: Extraocular movements intact.     Conjunctiva/sclera: Conjunctivae normal.     Pupils: Pupils are equal, round,  and reactive to light.  Neck:     Thyroid: No thyromegaly.     Vascular: No carotid bruit.  Cardiovascular:     Rate and Rhythm: Normal rate and regular rhythm.     Pulses: Normal pulses.     Heart sounds: Normal heart sounds. No murmur.  Pulmonary:     Effort: Pulmonary effort is normal.     Breath sounds: Normal breath sounds.  Abdominal:     General: Abdomen is flat.  Bowel sounds are normal. There is no distension.     Palpations: Abdomen is soft.     Tenderness: There is no abdominal tenderness. There is no guarding or rebound.     Comments: +murphy's sign   Musculoskeletal:     Cervical back: Normal range of motion and neck supple.  Lymphadenopathy:     Cervical: No cervical adenopathy.  Skin:    General: Skin is warm and dry.     Capillary Refill: Capillary refill takes less than 2 seconds.     Findings: No rash.  Neurological:     General: No focal deficit present.     Mental Status: She is alert and oriented to person, place, and time.     Cranial Nerves: No cranial nerve deficit.     Coordination: Coordination normal.     Deep Tendon Reflexes: Reflexes normal.  Psychiatric:        Mood and Affect: Mood normal.        Behavior: Behavior normal.          Office Visit from 08/10/2019 in [facility]  PHQ-2 Total Score  0      Assessment/plan: 1. Annual physical exam Routine labs. Will come back fasting. UTD on HM. Was losing weight on keto and is going to start this back up. Encouraged exercise as well. F/u in one year or as needed. Also requested she try to find her cscope records.  Patient counseling [x]    Nutrition: Stressed importance of moderation in sodium/caffeine intake, saturated fat and cholesterol, caloric balance, sufficient intake of fresh fruits, vegetables, fiber, calcium, iron, and 1 mg of folate supplement per day (for females capable of pregnancy).  [x]    Stressed the importance of regular exercise.   []    Substance Abuse: Discussed cessation/primary prevention of tobacco, alcohol, or other drug use; driving or other dangerous activities under the influence; availability of treatment for abuse.   [x]    Injury prevention: Discussed safety belts, safety helmets, smoke detector, smoking near bedding or upholstery.   [x]    Sexuality: Discussed sexually transmitted diseases, partner selection, use of  condoms, avoidance of unintended pregnancy  and contraceptive alternatives.  [x]    Dental health: Discussed importance of regular tooth brushing, flossing, and dental visits.  [x]    Health maintenance and immunizations reviewed. Please refer to Health maintenance section.    - Comprehensive metabolic panel; Future - TSH; Future - VITAMIN D 25 Hydroxy (Vit-D Deficiency, Fractures); Future - CBC with Differential/Platelet; Future  2. Mixed hyperlipidemia Well controlled on her crestor. Continue this and encouraged exercise/weight loss. Will come back for fasting labs.  - Lipid panel; Future  3. Other iron deficiency anemia  - Iron, TIBC and Ferritin Panel; Future  4. RUQ pain Labs/US. Precautions given.  - Abdomen Complete; Future  5. Epigastric pain ? If hernia is bothering her vs. Gastritis vs. PUD. Checking labs, h.pylori and will start her on PPI after I get her h.pylori back. If no improvement after  a month will need to see GI> GERD diet discussed as well.  - H. pylori breath test; Future  This visit occurred during the SARS-CoV-2 public health emergency.  Safety protocols were in place, including screening questions prior to the visit, additional usage of staff PPE, and extensive cleaning of exam room while observing appropriate contact time as indicated for disinfecting solutions.     Return in about 2 weeks (around 08/24/2019) for labs. Orma Flaming, MD [place]  08/10/2019

## 2019-08-11 ENCOUNTER — Other Ambulatory Visit: Payer: Self-pay | Admitting: Family Medicine

## 2019-08-11 DIAGNOSIS — R1011 Right upper quadrant pain: Secondary | ICD-10-CM

## 2019-08-24 ENCOUNTER — Other Ambulatory Visit (INDEPENDENT_AMBULATORY_CARE_PROVIDER_SITE_OTHER): Payer: Managed Care, Other (non HMO)

## 2019-08-24 ENCOUNTER — Other Ambulatory Visit: Payer: Self-pay

## 2019-08-24 DIAGNOSIS — D508 Other iron deficiency anemias: Secondary | ICD-10-CM

## 2019-08-24 DIAGNOSIS — E782 Mixed hyperlipidemia: Secondary | ICD-10-CM | POA: Diagnosis not present

## 2019-08-24 DIAGNOSIS — Z0000 Encounter for general adult medical examination without abnormal findings: Secondary | ICD-10-CM

## 2019-08-24 DIAGNOSIS — R1013 Epigastric pain: Secondary | ICD-10-CM

## 2019-08-24 LAB — COMPREHENSIVE METABOLIC PANEL
ALT: 30 U/L (ref 0–35)
AST: 29 U/L (ref 0–37)
Albumin: 4.5 g/dL (ref 3.5–5.2)
Alkaline Phosphatase: 57 U/L (ref 39–117)
BUN: 16 mg/dL (ref 6–23)
CO2: 31 mEq/L (ref 19–32)
Calcium: 9.8 mg/dL (ref 8.4–10.5)
Chloride: 101 mEq/L (ref 96–112)
Creatinine, Ser: 0.62 mg/dL (ref 0.40–1.20)
GFR: 103.05 mL/min (ref >60.00)
Glucose, Bld: 92 mg/dL (ref 70–99)
Potassium: 4.6 mEq/L (ref 3.5–5.1)
Sodium: 140 mEq/L (ref 135–145)
Total Bilirubin: 0.6 mg/dL (ref 0.2–1.2)
Total Protein: 7.7 g/dL (ref 6.0–8.3)

## 2019-08-24 LAB — CBC WITH DIFFERENTIAL/PLATELET
Basophils Absolute: 0 10*3/uL (ref 0.0–0.1)
Basophils Relative: 0.4 % (ref 0.0–3.0)
Eosinophils Absolute: 0.1 10*3/uL (ref 0.0–0.7)
Eosinophils Relative: 1.6 % (ref 0.0–5.0)
HCT: 37.3 % (ref 36.0–46.0)
Hemoglobin: 12.1 g/dL (ref 12.0–15.0)
Lymphocytes Relative: 36.1 % (ref 12.0–46.0)
Lymphs Abs: 1.3 10*3/uL (ref 0.7–4.0)
MCHC: 32.3 g/dL (ref 30.0–36.0)
MCV: 76.1 fl — ABNORMAL LOW (ref 78.0–100.0)
Monocytes Absolute: 0.3 10*3/uL (ref 0.1–1.0)
Monocytes Relative: 8.5 % (ref 3.0–12.0)
Neutro Abs: 1.9 10*3/uL (ref 1.4–7.7)
Neutrophils Relative %: 53.4 % (ref 43.0–77.0)
Platelets: 247 10*3/uL (ref 150.0–400.0)
RBC: 4.91 Mil/uL (ref 3.87–5.11)
RDW: 16 % — ABNORMAL HIGH (ref 11.5–15.5)
WBC: 3.6 10*3/uL — ABNORMAL LOW (ref 4.0–10.5)

## 2019-08-24 LAB — LIPID PANEL
Cholesterol: 237 mg/dL — ABNORMAL HIGH (ref 0–200)
HDL: 98.4 mg/dL (ref >39.00)
LDL Cholesterol: 121 mg/dL — ABNORMAL HIGH (ref 0–99)
NonHDL: 138.43
Total CHOL/HDL Ratio: 2
Triglycerides: 86 mg/dL (ref 0.0–149.0)
VLDL: 17.2 mg/dL (ref 0.0–40.0)

## 2019-08-24 LAB — TSH: TSH: 2.89 u[IU]/mL (ref 0.35–4.50)

## 2019-08-24 LAB — VITAMIN D 25 HYDROXY (VIT D DEFICIENCY, FRACTURES): VITD: 30.62 ng/mL (ref 30.00–100.00)

## 2019-08-25 ENCOUNTER — Ambulatory Visit
Admission: RE | Admit: 2019-08-25 | Discharge: 2019-08-25 | Disposition: A | Discharge status: Home / Self Care | Payer: Managed Care, Other (non HMO) | Source: Ambulatory Visit | Attending: Family Medicine | Admitting: Family Medicine

## 2019-08-25 DIAGNOSIS — R1011 Right upper quadrant pain: Secondary | ICD-10-CM

## 2019-08-25 LAB — H. PYLORI BREATH TEST: H. pylori Breath Test: NOT DETECTED

## 2019-08-25 LAB — IRON,TIBC AND FERRITIN PANEL
%SAT: 34 % (calc) (ref 16–45)
Ferritin: 213 ng/mL (ref 16–232)
Iron: 96 ug/dL (ref 40–190)
TIBC: 281 mcg/dL (calc) (ref 250–450)

## 2019-08-26 ENCOUNTER — Encounter: Payer: Self-pay | Admitting: Family Medicine

## 2019-08-26 ENCOUNTER — Other Ambulatory Visit: Payer: Self-pay | Admitting: Family Medicine

## 2019-08-26 MED ORDER — PANTOPRAZOLE SODIUM 40 MG PO TBEC: 40.0000 mg | DELAYED_RELEASE_TABLET | Freq: Every day | ORAL | 0 refills | Status: DC

## 2019-12-03 ENCOUNTER — Encounter: Payer: Self-pay | Admitting: Family Medicine

## 2019-12-16 ENCOUNTER — Ambulatory Visit (INDEPENDENT_AMBULATORY_CARE_PROVIDER_SITE_OTHER): Payer: BC Managed Care – PPO | Admitting: Family Medicine

## 2019-12-16 ENCOUNTER — Encounter: Payer: Self-pay | Admitting: Family Medicine

## 2019-12-16 ENCOUNTER — Encounter: Payer: Self-pay | Admitting: Gastroenterology

## 2019-12-16 ENCOUNTER — Other Ambulatory Visit: Payer: Self-pay

## 2019-12-16 VITALS — BP 134/70 | HR 72 | Temp 97.8°F | Ht 60.0 in | Wt 153.0 lb

## 2019-12-16 DIAGNOSIS — K449 Diaphragmatic hernia without obstruction or gangrene: Secondary | ICD-10-CM | POA: Diagnosis not present

## 2019-12-16 DIAGNOSIS — K219 Gastro-esophageal reflux disease without esophagitis: Secondary | ICD-10-CM | POA: Diagnosis not present

## 2019-12-16 MED ORDER — PANTOPRAZOLE SODIUM 40 MG PO TBEC: 40.0000 mg | DELAYED_RELEASE_TABLET | Freq: Two times a day (BID) | ORAL | 0 refills | Status: DC

## 2019-12-16 NOTE — Progress Notes (Signed)
Patient: Madison Esparza MRN: [id number] DOB: [birth year] PCP: Orland Mustard, MD     Subjective:  Chief Complaint  Patient presents with  . Gastroesophageal Reflux    HPI: The patient is a 49 y.o. female who presents today for indigestion. She says that she feels like something is in her throat, but she knows nothing is there.  GERD/hiatal hernia -she was diagnosed with hiatal hernia over 5 years ago. She had bad pain and thought she was having a heart attack and found on imaging. She was not put on px medication at that time and started over the counter nexium. I saw her in march and she thought her reflux was getting worse. WE put her on px protonix and acid reflux went away. She continued to have indigestion (bloating stomach, pain in epigastric area).  About 2-3 weeks ago she started to have symptoms of having a globus sensation in her throat evening upon waking; however, she started having this after she was finished with her protonix and did not get a refill. NO nausea/vomiting, no worsening pain with food. No Nsaids.   Has had her covid vaccines  Review of Systems  Constitutional: Negative for chills, fatigue and fever.  Cardiovascular: Negative for chest pain and palpitations.  Gastrointestinal: Negative for abdominal pain, blood in stool, diarrhea, nausea and vomiting.    Allergies Patient is allergic to motrin [ibuprofen].  Past Medical History Patient  has a past medical history of Allergy, Anxiety, and Hyperlipidemia.  Surgical History Patient  has no past surgical history on file.  Family History Pateint's family history includes Arthritis in her maternal grandmother, mother, and sister; Breast cancer in her cousin; Hearing loss in her maternal grandfather and maternal grandmother; Heart attack in her sister; Heart disease in her brother and sister; Hyperlipidemia in her mother; Hypertension in her maternal grandmother; Miscarriages / [place] in her mother.  Social  History Patient  reports that she has quit smoking. Her smoking use included cigarettes. She has never used smokeless tobacco. She reports current alcohol use. She reports that she does not use drugs.    Objective: Vitals:   12/16/19 0843  BP: 134/70  Pulse: 72  Temp: 97.8 F (36.6 C)  TempSrc: Temporal  SpO2: 99%  Weight: 153 lb (69.4 kg)  Height: 5' (1.524 m)    Body mass index is 29.88 kg/m.  Physical Exam Vitals reviewed.  Constitutional:      Appearance: Normal appearance. She is obese.  HENT:     Head: Normocephalic and atraumatic.  Cardiovascular:     Rate and Rhythm: Normal rate and regular rhythm.     Heart sounds: Normal heart sounds.  Pulmonary:     Effort: Pulmonary effort is normal.     Breath sounds: Normal breath sounds.  Abdominal:     General: Abdomen is flat. Bowel sounds are normal.     Palpations: Abdomen is soft. There is no mass.     Tenderness: There is no abdominal tenderness.     Hernia: No hernia is present.  Skin:    Capillary Refill: Capillary refill takes less than 2 seconds.  Neurological:     General: No focal deficit present.     Mental Status: She is alert and oriented to person, place, and time.  Psychiatric:        Mood and Affect: Mood normal.        Behavior: Behavior normal.        Assessment/plan: 1. Hiatal hernia Increasing protonix  to BID. Printed off GERD diet for her as some of the foods she is eating can contribute. ? If her hernia is bigger or just GERD. Likely will need EGD. Referral to GI.  - Ambulatory referral to Gastroenterology  2. Gastroesophageal reflux disease, unspecified whether esophagitis present See above.  - Ambulatory referral to Gastroenterology    This visit occurred during the SARS-CoV-2 public health emergency.  Safety protocols were in place, including screening questions prior to the visit, additional usage of staff PPE, and extensive cleaning of exam room while observing appropriate contact  time as indicated for disinfecting solutions.     Return if symptoms worsen or fail to improve.   Orland Mustard, MD [place] [facility]   12/16/2019

## 2019-12-16 NOTE — Patient Instructions (Signed)
Increasing protonix twice a day Referral to GI, likely will need upper endoscopy ? If hiatal hernia is also contributing   Food Choices for Gastroesophageal Reflux Disease, Adult When you have gastroesophageal reflux disease (GERD), the foods you eat and your eating habits are very important. Choosing the right foods can help ease your discomfort. Think about working with a nutrition specialist (dietitian) to help you make good choices. What are tips for following this plan?  Meals  Choose healthy foods that are low in fat, such as fruits, vegetables, whole grains, low-fat dairy products, and lean meat, fish, and poultry.  Eat small meals often instead of 3 large meals a day. Eat your meals slowly, and in a place where you are relaxed. Avoid bending over or lying down until 2-3 hours after eating.  Avoid eating meals 2-3 hours before bed.  Avoid drinking a lot of liquid with meals.  Cook foods using methods other than frying. Bake, grill, or broil food instead.  Avoid or limit: ? Chocolate. ? Peppermint or spearmint. ? Alcohol. ? Pepper. ? Black and decaffeinated coffee. ? Black and decaffeinated tea. ? Bubbly (carbonated) soft drinks. ? Caffeinated energy drinks and soft drinks.  Limit high-fat foods such as: ? Fatty meat or fried foods. ? Whole milk, cream, butter, or ice cream. ? Nuts and nut butters. ? Pastries, donuts, and sweets made with butter or shortening.  Avoid foods that cause symptoms. These foods may be different for everyone. Common foods that cause symptoms include: ? Tomatoes. ? Oranges, lemons, and limes. ? Peppers. ? Spicy food. ? Onions and garlic. ? Vinegar. Lifestyle  Maintain a healthy weight. Ask your doctor what weight is healthy for you. If you need to lose weight, work with your doctor to do so safely.  Exercise for at least 30 minutes for 5 or more days each week, or as told by your doctor.  Wear loose-fitting clothes.  Do not smoke. If  you need help quitting, ask your doctor.  Sleep with the head of your bed higher than your feet. Use a wedge under the mattress or blocks under the bed frame to raise the head of the bed. Summary  When you have gastroesophageal reflux disease (GERD), food and lifestyle choices are very important in easing your symptoms.  Eat small meals often instead of 3 large meals a day. Eat your meals slowly, and in a place where you are relaxed.  Limit high-fat foods such as fatty meat or fried foods.  Avoid bending over or lying down until 2-3 hours after eating.  Avoid peppermint and spearmint, caffeine, alcohol, and chocolate. This information is not intended to replace advice given to you by your health care provider. Make sure you discuss any questions you have with your health care provider. Document Revised: 09/16/2018 Document Reviewed: 07/01/2016 Elsevier Patient Education  2020 [employer].

## 2020-02-06 ENCOUNTER — Other Ambulatory Visit: Payer: Self-pay | Admitting: Family Medicine

## 2020-02-08 DIAGNOSIS — Z01419 Encounter for gynecological examination (general) (routine) without abnormal findings: Secondary | ICD-10-CM | POA: Diagnosis not present

## 2020-02-17 ENCOUNTER — Ambulatory Visit: Payer: BC Managed Care – PPO | Admitting: Gastroenterology

## 2020-03-14 ENCOUNTER — Ambulatory Visit: Payer: BC Managed Care – PPO | Admitting: Physician Assistant

## 2020-03-14 ENCOUNTER — Other Ambulatory Visit: Payer: Self-pay

## 2020-03-14 ENCOUNTER — Encounter: Payer: Self-pay | Admitting: Physician Assistant

## 2020-03-14 VITALS — BP 126/74 | HR 82 | Temp 98.1°F | Ht 60.0 in | Wt 148.4 lb

## 2020-03-14 DIAGNOSIS — R21 Rash and other nonspecific skin eruption: Secondary | ICD-10-CM

## 2020-03-14 MED ORDER — GABAPENTIN 100 MG PO CAPS: ORAL_CAPSULE | ORAL | 1 refills | Status: DC

## 2020-03-14 MED ORDER — VALACYCLOVIR HCL 1 G PO TABS: 1000.0000 mg | ORAL_TABLET | Freq: Three times a day (TID) | ORAL | 0 refills | Status: AC

## 2020-03-14 NOTE — Progress Notes (Signed)
Madison Esparza is a 49 y.o. female here for a new problem.    History of Present Illness:   Chief Complaint  Patient presents with  . Lymphadenopathy    neck area x3days    HPI    Rash  Patient reports swelling and tenderness to R side of face. Started in R side of lymph node about 3 days ago. Developed a lesion on her R lower lip, and then a few more lesions on her R jaw/cheek area. Denies any vision changes or lesions on her nose.  Had possible URI go through her house a few weeks ago.  Denies: fevers, chills, malaise  Past Medical History:  Diagnosis Date  . Allergy   . Anxiety   . Hyperlipidemia      Social History   Tobacco Use  . Smoking status: Former Smoker    Types: Cigarettes  . Smokeless tobacco: Never Used  Vaping Use  . Vaping Use: Never used  Substance Use Topics  . Alcohol use: Yes    Alcohol/week: 0.0 standard drinks    Comment: occasional  . Drug use: Never    History reviewed. No pertinent surgical history.  Family History  Problem Relation Age of Onset  . Breast cancer Cousin   . Arthritis Mother   . Hyperlipidemia Mother   . Miscarriages / [place] Mother   . Arthritis Sister   . Heart attack Sister   . Heart disease Sister   . Heart disease Brother   . Arthritis Maternal Grandmother   . Hearing loss Maternal Grandmother   . Hypertension Maternal Grandmother   . Hearing loss Maternal Grandfather     Allergies  Allergen Reactions  . Motrin [Ibuprofen] Nausea Only    Current Medications:   Current Outpatient Medications:  .  B Complex-C-Folic Acid (SUPER B COMPLEX/FA/VIT C) TABS, , Disp: , Rfl:  .  Cholecalciferol (VITAMIN D3) 50 MCG (2000 UT) capsule, , Disp: , Rfl:  .  fluticasone (FLONASE) 50 MCG/ACT nasal spray, Use 1 spray(s) in each nostril once daily, Disp: 16 g, Rfl: 3 .  Iron-Vitamin C (VITRON-C) 65-125 MG TABS, , Disp: , Rfl:  .  MAGNESIUM PO, Take by mouth., Disp: , Rfl:  .  pantoprazole (PROTONIX) 40 MG tablet,  Take 1 tablet (40 mg total) by mouth daily., Disp: 90 tablet, Rfl: 0 .  rosuvastatin (CRESTOR) 10 MG tablet, Take 1 tablet by mouth once daily, Disp: 90 tablet, Rfl: 3 .  gabapentin (NEURONTIN) 100 MG capsule, Take one tablet ( 100 mg) by mouth one hour before bedtime every night. May increase by 100 mg nightly to goal of 300 mg., Disp: 30 capsule, Rfl: 1 .  valACYclovir (VALTREX) 1000 MG tablet, Take 1 tablet (1,000 mg total) by mouth 3 (three) times daily for 7 days., Disp: 21 tablet, Rfl: 0   Review of Systems:   ROS  Negative unless otherwise specified per HPI.  Vitals:   Vitals:   03/14/20 1427  BP: 126/74  Pulse: 82  Temp: 98.1 F (36.7 C)  TempSrc: Temporal  SpO2: 98%  Weight: 148 lb 6.4 oz (67.3 kg)  Height: 5' (1.524 m)     Body mass index is 28.98 kg/m.  Physical Exam:   Physical Exam Vitals and nursing note reviewed.  Constitutional:      General: She is not in acute distress.    Appearance: She is well-developed. She is not ill-appearing or toxic-appearing.  Cardiovascular:     Rate and Rhythm:  Normal rate and regular rhythm.     Pulses: Normal pulses.     Heart sounds: Normal heart sounds, S1 normal and S2 normal.     Comments: No LE edema Pulmonary:     Effort: Pulmonary effort is normal.     Breath sounds: Normal breath sounds.  Lymphadenopathy:     Cervical: Cervical adenopathy present.     Right cervical: Superficial cervical adenopathy present.  Skin:    General: Skin is warm and dry.     Comments: Erythematous lesion to R lower outer lip Erythematous blisters to R cheek Small scab to R jaw area  Neurological:     Mental Status: She is alert.     GCS: GCS eye subscore is 4. GCS verbal subscore is 5. GCS motor subscore is 6.  Psychiatric:        Speech: Speech normal.        Behavior: Behavior normal. Behavior is cooperative.      Assessment and Plan:   Katelan was seen today for lymphadenopathy.  Diagnoses and all orders for this  visit:  Rash and nonspecific skin eruption No red flags on exam. Suspect shingles. Start oral valtrex. She cannot tolerate oral ibuprofen. Provided rx for gabapentin, or may trial tylenol. Follow-up if any worsening symptoms including -- nose or eye involvement, concerns for infection, or severe pain. Handout provided. She works as a Producer, television/film/video -- I have recommended that she avoid work for a few days until blisters scab.  Other orders -     valACYclovir (VALTREX) 1000 MG tablet; Take 1 tablet (1,000 mg total) by mouth 3 (three) times daily for 7 days. -     gabapentin (NEURONTIN) 100 MG capsule; Take one tablet ( 100 mg) by mouth one hour before bedtime every night. May increase by 100 mg nightly to goal of 300 mg.  . Reviewed expectations re: course of current medical issues. . Discussed self-management of symptoms. . Outlined signs and symptoms indicating need for more acute intervention. . Patient verbalized understanding and all questions were answered. . See orders for this visit as documented in the electronic medical record. . Patient received an After-Visit Summary.  CMA or LPN served as scribe during this visit. History, Physical, and Plan performed by medical provider. The above documentation has been reviewed and is accurate and complete.   Jarold Motto, PA-C

## 2020-03-14 NOTE — Patient Instructions (Signed)
It was great to see you!  I suspect you have shingles. Start valtrex medication (an antiviral). You may use the gabapentin for pain if you'd like. May also trial tylenol.    Shingles Shingles, which is also known as herpes zoster, is an infection that causes a painful skin rash and fluid-filled blisters. It is caused by a virus. Shingles only develops in people who:  Have had chickenpox.  Have been given a medicine to protect against chickenpox (have been vaccinated). Shingles is rare in this group. What are the causes? Shingles is caused by varicella-zoster virus (VZV). This is the same virus that causes chickenpox. After a person is exposed to VZV, the virus stays in the body in an inactive (dormant) state. Shingles develops if the virus is reactivated. This can happen many years after the first (initial) exposure to VZV. It is not known what causes this virus to be reactivated. What increases the risk? People who have had chickenpox or received the chickenpox vaccine are at risk for shingles. Shingles infection is more common in people who:  Are older than age 20.  Have a weakened disease-fighting system (immune system), such as people with: ? HIV. ? AIDS. ? Cancer.  Are taking medicines that weaken the immune system, such as transplant medicines.  Are experiencing a lot of stress. What are the signs or symptoms? Early symptoms of this condition include itching, tingling, and pain in an area on your skin. Pain may be described as burning, stabbing, or throbbing. A few days or weeks after early symptoms start, a painful red rash appears. The rash is usually on one side of the body and has a band-like or belt-like pattern. The rash eventually turns into fluid-filled blisters that break open, change into scabs, and dry up in about 2-3 weeks. At any time during the infection, you may also develop:  A fever.  Chills.  A headache.  An upset stomach. How is this diagnosed? This  condition is diagnosed with a skin exam. Skin or fluid samples may be taken from the blisters before a diagnosis is made. These samples are examined under a microscope or sent to a lab for testing. How is this treated? The rash may last for several weeks. There is not a specific cure for this condition. Your health care provider will probably prescribe medicines to help you manage pain, recover more quickly, and avoid long-term problems. Medicines may include:  Antiviral drugs.  Anti-inflammatory drugs.  Pain medicines.  Anti-itching medicines (antihistamines). If the area involved is on your face, you may be referred to a specialist, such as an eye doctor (ophthalmologist) or an ear, nose, and throat (ENT) doctor (otolaryngologist) to help you avoid eye problems, chronic pain, or disability. Follow these instructions at home: Medicines  Take over-the-counter and prescription medicines only as told by your health care provider.  Apply an anti-itch cream or numbing cream to the affected area as told by your health care provider. Relieving itching and discomfort  Apply cold, wet cloths (cold compresses) to the area of the rash or blisters as told by your health care provider.  Cool baths can be soothing. Try adding baking soda or dry oatmeal to the water to reduce itching. Do not bathe in hot water. Blister and rash care  Keep your rash covered with a loose bandage (dressing). Wear loose-fitting clothing to help ease the pain of material rubbing against the rash.  Keep your rash and blisters clean by washing the area with  mild soap and cool water as told by your health care provider.  Check your rash every day for signs of infection. Check for: ? More redness, swelling, or pain. ? Fluid or blood. ? Warmth. ? Pus or a bad smell.  Do not scratch your rash or pick at your blisters. To help avoid scratching: ? Keep your fingernails clean and cut short. ? Wear gloves or mittens while  you sleep, if scratching is a problem. General instructions  Rest as told by your health care provider.  Keep all follow-up visits as told by your health care provider. This is important.  Wash your hands often with soap and water. If soap and water are not available, use hand sanitizer. Doing this lowers your chance of getting a bacterial skin infection.  Before your blisters change into scabs, your shingles infection can cause chickenpox in people who have never had it or have never been vaccinated against it. To prevent this from happening, avoid contact with other people, especially: ? Babies. ? Pregnant women. ? Children who have eczema. ? Elderly people who have transplants. ? People who have chronic illnesses, such as cancer or AIDS. Contact a health care provider if:  Your pain is not relieved with prescribed medicines.  Your pain does not get better after the rash heals.  You have signs of infection in the rash area, such as: ? More redness, swelling, or pain around the rash. ? Fluid or blood coming from the rash. ? The rash area feeling warm to the touch. ? Pus or a bad smell coming from the rash. Get help right away if:  The rash is on your face or nose.  You have facial pain, pain around your eye area, or loss of feeling on one side of your face.  You have difficulty seeing.  You have ear pain or have ringing in your ear.  You have a loss of taste.  Your condition gets worse. Summary  Shingles, which is also known as herpes zoster, is an infection that causes a painful skin rash and fluid-filled blisters.  This condition is diagnosed with a skin exam. Skin or fluid samples may be taken from the blisters and examined before the diagnosis is made.  Keep your rash covered with a loose bandage (dressing). Wear loose-fitting clothing to help ease the pain of material rubbing against the rash.  Before your blisters change into scabs, your shingles infection can  cause chickenpox in people who have never had it or have never been vaccinated against it.

## 2020-03-19 ENCOUNTER — Telehealth: Payer: Self-pay

## 2020-03-19 NOTE — Telephone Encounter (Signed)
Initial Comment Diagnosed with shingles on Wednesday and given gabapentin. It is not helping with the pain, wants to more if she can take more than the directions state. Translation No Nurse Assessment Nurse: Nolene Ebbs, RN, Dawn Date/Time (Eastern Time): 03/17/2020 3:53:18 PM Please select the assessment type ---Pharmacy clarification Additional Documentation ---Caller wants to take more, no pain relief Is there an on-call physician for the client? ---Yes Do the client directives allow paging the on call for medication concerns? ---Yes Additional Documentation ---[facility] [phone] NKA Nurse: Nolene Ebbs, RN, Dawn Date/Time (Eastern Time): 03/17/2020 3:49:12 PM Confirm and document reason for call. If symptomatic, describe symptoms. ---Caller was Diagnosed with shingles on Wednesday and given gabapentin. It is not helping with the pain, wants to more if she can take more than the directions state.started taking it friday night script 100mg  can take up to 300mg , is not helping Does the patient have any new or worsening symptoms? ---Yes Will a triage be completed? ---Yes Related visit to physician within the last 2 weeks? ---Yes Does the PT have any chronic conditions? (i.e. diabetes, asthma, this includes High risk factors for pregnancy, etc.) ---No Is the patient pregnant or possibly pregnant? (Ask all females between the ages of 52-55) ---No Is this a behavioral health or substance abuse call? ---No PLEASE NOTE: All timestamps contained within this report are represented as Standard Time. CONFIDENTIALTY NOTICE: This fax transmission is intended only for the addressee. It contains information that is legally privileged, confidential or otherwise protected from use or disclosure. If you are not the intended recipient, you are strictly prohibited from reviewing, disclosing, copying using or disseminating any of this information or taking any action in reliance on or regarding this  information. If you have received this fax in error, please notify [phone] immediately by telephone so that we can arrange for its return to [place]. Phone: [phone], Toll-Free: [phone], Fax: [phone] Page: 2 of 2 Call Id: [phone] Guidelines Guideline Title Affirmed Question Affirmed Notes Nurse Date/Time [phone] Time) Shingles SEVERE pain (e.g., excruciating) [id number], RN, [facility] 03/17/2020 3:55:56 PM Disp. Time [facility] Time) Disposition Final User 03/17/2020 3:58:30 PM Paged On Call back to [facility], RN, 05/17/2020 03/17/2020 4:07:08 PM Call Completed [place], RN, Dawn 03/17/2020 3:57:05 PM See PCP within 24 Hours Yes Nolene Ebbs, RN, 05/17/2020 Caller Disagree/Comply Comply Caller Understands Yes PreDisposition Call Doctor Care Advice Given Per Guideline SEE PCP WITHIN 24 HOURS: * IF OFFICE WILL BE OPEN: You need to be examined within the next 24 hours. Call your doctor (or NP/PA) when the office opens and make an appointment. CARE ADVICE given per Shingles (Adult) guideline. CALL BACK IF: * You become worse Comments User: Nolene Ebbs, RN Date/Time (Eastern Time): 03/17/2020 4:43:00 PM Information from Dr relayed to pt, she verbalizes understanding that she can take Gabapentin 3 times a day up to 300mg  per dose, will contact PCP if pain not better Referrals REFERRED TO PCP OFFICE Paging DoctorName Phone DateTime Result/Outcome Message Type Notes Jabier Gauss - MD 05/17/2020 03/17/2020 3:58:30 PM Paged On Call Back to Call Center Doctor Paged Please contact Dawn RN at [phone] regarding pt med [phone] - MD 03/17/2020 4:05:15 PM Spoke with On Call - General Message Result Dr. [phone] states pt may increase gabapentin to 300mg  three times a day, contact office on monday if no improvemen

## 2020-03-19 NOTE — Telephone Encounter (Signed)
If gabapentin works, can take 1 pill three times a day or I can send in 300mg  dosage. I typically do 300mg  I also do pain medication as this is painful. We can do either tramadol or tylenol 3. Just let me know what she prefers.  , MD [place] [facility]

## 2020-03-19 NOTE — Telephone Encounter (Signed)
Pt wants to know can she take more meds than what that Rx states.

## 2020-03-19 NOTE — Telephone Encounter (Signed)
Called and lm for pt tcb. 

## 2020-03-20 MED ORDER — TRAMADOL HCL 50 MG PO TABS: 50.0000 mg | ORAL_TABLET | Freq: Three times a day (TID) | ORAL | 0 refills | Status: AC | PRN

## 2020-03-20 MED ORDER — GABAPENTIN 300 MG PO CAPS: 300.0000 mg | ORAL_CAPSULE | Freq: Three times a day (TID) | ORAL | 0 refills | Status: DC

## 2020-03-20 NOTE — Addendum Note (Signed)
Addended by: Orland Mustard on: 03/20/2020 11:05 AM   Modules accepted: Orders

## 2020-03-20 NOTE — Telephone Encounter (Signed)
Sent in new px of gabapentin 300mg  TID for one month.  Also sent in short supply of tramadol.  She is fine to take this together.  I left a VM on her phone with the above message.   Dr. 

## 2020-03-20 NOTE — Telephone Encounter (Signed)
Pt states that she has been taking (2) Gabapentin 3 times daily, she says that it takes the edge off. She says that she will run out of her medication (100mg  tab) and is requesting a refill as soon as today. She is very uncomfortable and does not want to have to wait until tomorrow to refill. She says that she is familiar with taking Tramadol, and it will not make her drowsy so she can work while taking it. And also wants to know if she can take these medications together?  Please Advise.

## 2020-04-11 ENCOUNTER — Telehealth: Payer: Self-pay

## 2020-04-11 NOTE — Telephone Encounter (Signed)
Reviewed pt chart for upcoming NP appt with Dr. Orvan Falconer on 04/13/20. Appears pt sent a My Chart message 08/26/19 stating she "has not had any luck locating her colonoscopy info". Further added she "may have to call the colon specialist I saw". Called pt to inquire further. Unable to reach pt

## 2020-04-13 ENCOUNTER — Encounter: Payer: Self-pay | Admitting: Gastroenterology

## 2020-04-13 ENCOUNTER — Ambulatory Visit: Payer: BC Managed Care – PPO | Admitting: Gastroenterology

## 2020-04-13 VITALS — BP 126/88 | HR 85 | Ht 61.0 in | Wt 149.8 lb

## 2020-04-13 DIAGNOSIS — R09A2 Foreign body sensation, throat: Secondary | ICD-10-CM

## 2020-04-13 DIAGNOSIS — R0989 Other specified symptoms and signs involving the circulatory and respiratory systems: Secondary | ICD-10-CM

## 2020-04-13 DIAGNOSIS — R198 Other specified symptoms and signs involving the digestive system and abdomen: Secondary | ICD-10-CM | POA: Diagnosis not present

## 2020-04-13 NOTE — Patient Instructions (Signed)
I recommend that you continue to take your pantoprazole twice daily.  I have also recommended an EGD for further evaluation.  We will try to obtain to your records from [place] to make sure that we are taking good care of you and your gut.   In the meantime, let's try to identify a dietary trigger. Please keep a records of what you eat and your symptoms until we see each other again.  During that time I would try the following (do only one change at a time)       - No carbonated beverages       - No artificial sweeteners       - Full lactose free trial: 3 weeks NO, milk, cheese, sour cream,ice cream, yogurt, creamer, baked goods (cookies/cakes/watch breads), chocolate (even dark chocolate), protein bars, dressing/condiments (watch). You won't live like this forever but for the trial need to be strict.

## 2020-04-13 NOTE — Progress Notes (Addendum)
Referring Provider: Orland Mustard, MD Primary Care Physician:  Orland Mustard, MD  Reason for Consultation:  Digestion issues, feels like something is stuck in the throat, hiatal hernia   IMPRESSION:  GERD improved on pantoprazole Globus - present despite resolution of GERD symptoms Fleeting abdominal pain - occurring every 6 months Asymptomatic cholelithiasis, seen on ultrasound  Hiatal hernia (per patient report) Suspected fatty liver on ultrasound with normal liver enzymes Anal fistula with patient reported normal colonoscopy 5 years ago in [place] History of iron deficiency anemia    - labs 08/24/19 show normal iron, ferritin, hemoglobin but persistent microcytosis  Globus: EGD recommended to evaluate for gastroesophageal reflux, gastric inlet pouch in the proximal esophagus, and eosinophilic esophagitis as the cause of symptoms.  If upper endoscopy is negative would consider esophageal manometry, esophageal impedance, and pH testing to exclude esophageal motility disorders. Continue PPI therapy. Consider amitriptyline 25 mg daily if no response to PPI.  History of anal fisulta: Will work to obtain prior records from care in [place].     PLAN: Continue pantoprazole 40 mg BID EGD Colonoscopy report from [facility] 5 years Obtain records from [facility] 7-8 years ago  Please see the "Patient Instructions" section for addition details about the plan.  HPI: Madison Esparza is a 49 y.o. female referred by Dr. Artis Flock.  The history is obtained through the patient and review of her electronic health record.  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   Longstanding history of sensitive stomach. Diagnosed clinically with a hiatal hernia on presentation to the ED 7-8 years ago for atypical chest pain. She was not treated with prescription medications and started Nexium OTC.    Colonoscopy 5 years ago in [place] for an anal fistula. Physician who performed the exam had his license revoked and she has been unable to obtain and records from that evaluation. No known history of polyps.   Seen by Dr. Artis Flock 08/2019 with concerns for worsening reflux. Abdominal ultrasound at that time showed cholelithiasis without biliary dilatation and suspected fatty liver.  Reflux symptoms improved on prescription pantoprazole. However, she continued to have bloating and epigastric pain. In June she developed globus sensation present when awake. There is no pain, lateralization of the symptoms, dysphagia, odynophagia, dysphonia, or weight loss. There is no history of radiation to the head neck, smoking, or alcohol consumption.  Multiple foods will trigger symptoms. While following a keto diet she felt less bloating.   Also reports an intermittent, fleeting abdominal "charlie horse" - last occurring when sitting up after her GYN exam.  Occurs every 6 months. No identified exacerbating or relieving features.   Red wine is her escape and she hopes that she doesn't have to give it up.   Had the ultrasound in March as mentioned above. No other abdominal imaging. Colonoscopy 5 years ago. No prior upper endoscopy.   Labs 08/24/19 show a normal CMP, iron 96, ferritin 213, WBC 3.6, hgb 12.1, MCV 76.1, RDW 16, platelets 247  No known family history of colon cancer or polyps. No family history of uterine/endometrial cancer, pancreatic cancer or gastric/stomach cancer.   Past Medical History:  Diagnosis Date  . Allergy   . Anxiety   . Hyperlipidemia     No past surgical history on file.  Current Outpatient Medications  Medication Sig Dispense Refill  . B Complex-C-Folic Acid (SUPER B COMPLEX/FA/VIT C) TABS     .  Cholecalciferol (VITAMIN D3) 50 MCG (2000 UT) capsule     . fluticasone (FLONASE) 50 MCG/ACT nasal spray Use 1 spray(s) in each nostril once daily 16 g 3  . gabapentin (NEURONTIN)  300 MG capsule Take 1 capsule (300 mg total) by mouth 3 (three) times daily. 90 capsule 0  . Iron-Vitamin C (VITRON-C) 65-125 MG TABS     . MAGNESIUM PO Take by mouth.    . pantoprazole (PROTONIX) 40 MG tablet Take 1 tablet (40 mg total) by mouth daily. 90 tablet 0  . rosuvastatin (CRESTOR) 10 MG tablet Take 1 tablet by mouth once daily 90 tablet 3   No current facility-administered medications for this visit.    Allergies as of 04/13/2020 - Review Complete 03/14/2020  Allergen Reaction Noted  . Motrin [ibuprofen] Nausea Only 06/30/2018    Family History  Problem Relation Age of Onset  . Breast cancer Cousin   . Arthritis Mother   . Hyperlipidemia Mother   . Miscarriages / [place] Mother   . Arthritis Sister   . Heart attack Sister   . Heart disease Sister   . Heart disease Brother   . Arthritis Maternal Grandmother   . Hearing loss Maternal Grandmother   . Hypertension Maternal Grandmother   . Hearing loss Maternal Grandfather     Social History   Socioeconomic History  . Marital status: Married    Spouse name: Not on file  . Number of children: Not on file  . Years of education: Not on file  . Highest education level: Not on file  Occupational History  . Not on file  Tobacco Use  . Smoking status: Former Smoker    Types: Cigarettes  . Smokeless tobacco: Never Used  Vaping Use  . Vaping Use: Never used  Substance and Sexual Activity  . Alcohol use: Yes    Alcohol/week: 0.0 standard drinks    Comment: occasional  . Drug use: Never  . Sexual activity: Yes    Partners: Male  Other Topics Concern  . Not on file  Social History Narrative  . Not on file   Social Determinants of Health   Financial Resource Strain:   . Difficulty of Paying Living Expenses: Not on file  Food Insecurity:   . Worried About Programme researcher, broadcasting/film/video in the Last Year: Not on file  . Ran Out of Food in the Last Year: Not on file  Transportation Needs:   . Lack of Transportation  (Medical): Not on file  . Lack of Transportation (Non-Medical): Not on file  Physical Activity:   . Days of Exercise per Week: Not on file  . Minutes of Exercise per Session: Not on file  Stress:   . Feeling of Stress : Not on file  Social Connections:   . Frequency of Communication with Friends and Family: Not on file  . Frequency of Social Gatherings with Friends and Family: Not on file  . Attends Religious Services: Not on file  . Active Member of Clubs or Organizations: Not on file  . Attends Banker Meetings: Not on file  . Marital Status: Not on file  Intimate Partner Violence:   . Fear of Current or Ex-Partner: Not on file  . Emotionally Abused: Not on file  . Physically Abused: Not on file  . Sexually Abused: Not on file    Review of Systems: 12 system ROS is negative except as noted above with the addition of back pain, muscle pains, night sweats  and recent swollen lymph glands when she had shingles..   Physical Exam: General:   Alert,  well-nourished, pleasant and cooperative in NAD Head:  Normocephalic and atraumatic. Eyes:  Sclera clear, no icterus.   Conjunctiva pink. Ears:  Normal auditory acuity. Nose:  No deformity, discharge,  or lesions. Mouth:  No deformity or lesions.   Neck:  Supple; no masses or thyromegaly. Lungs:  Clear throughout to auscultation.   No wheezes. Heart:  Regular rate and rhythm; no murmurs. Abdomen:  Soft,nontender, nondistended, normal bowel sounds, no rebound or guarding. No hepatosplenomegaly.   Rectal:  Deferred  Msk:  Symmetrical. No boney deformities LAD: No inguinal or umbilical LAD Extremities:  No clubbing or edema. Neurologic:  Alert and  oriented x4;  grossly nonfocal Skin:  Intact without significant lesions or rashes. Psych:  Alert and cooperative. Normal mood and affect.    Taina Landry L. Orvan Falconer, MD, MPH 04/13/2020, 1:21 PM

## 2020-04-16 ENCOUNTER — Encounter: Payer: Self-pay | Admitting: Gastroenterology

## 2020-04-24 ENCOUNTER — Telehealth: Payer: Self-pay

## 2020-04-24 NOTE — Telephone Encounter (Signed)
Will attempt to contact patient to find out what facility she may have had the colonoscopy at since the one she reported at her office visit does not have record of it there.

## 2020-04-24 NOTE — Telephone Encounter (Signed)
-----   Message from Blanch Media sent at 04/23/2020  3:58 PM EST ----- Regarding: wrong fax number Madison Esparza from [facility] is calling stating she received a fax for this pt's colonoscopy report/pathology which they do not do there.   Caller would like to inform to not send faxes to them please, Fax: [phone]

## 2020-04-26 NOTE — Telephone Encounter (Signed)
Left message for patient to return my call.

## 2020-04-26 NOTE — Telephone Encounter (Signed)
Routing this message to Dr. Orvan Falconer to make her aware of our efforts to obtain colonoscopy records.

## 2020-04-26 NOTE — Telephone Encounter (Signed)
Thank you for trying.

## 2020-05-02 ENCOUNTER — Telehealth: Payer: Self-pay

## 2020-05-02 NOTE — Telephone Encounter (Signed)
Received 25 pages of medical records from [facility]. Records have been labeled and placed on Dr. Orvan Falconer desk for her review.

## 2020-05-11 ENCOUNTER — Other Ambulatory Visit: Payer: Self-pay | Admitting: Family Medicine

## 2020-05-18 ENCOUNTER — Encounter: Payer: Self-pay | Admitting: Gastroenterology

## 2020-05-18 ENCOUNTER — Other Ambulatory Visit: Payer: Self-pay | Admitting: Family Medicine

## 2020-05-18 MED ORDER — GABAPENTIN 300 MG PO CAPS
300.0000 mg | ORAL_CAPSULE | Freq: Two times a day (BID) | ORAL | 3 refills | Status: DC
Start: 2020-05-18 — End: 2020-11-01

## 2020-05-23 ENCOUNTER — Ambulatory Visit (AMBULATORY_SURGERY_CENTER): Payer: BC Managed Care – PPO | Admitting: Gastroenterology

## 2020-05-23 ENCOUNTER — Encounter: Payer: Self-pay | Admitting: Gastroenterology

## 2020-05-23 ENCOUNTER — Other Ambulatory Visit: Payer: Self-pay

## 2020-05-23 VITALS — BP 116/63 | HR 66 | Temp 98.0°F | Resp 18 | Ht 61.0 in | Wt 149.0 lb

## 2020-05-23 DIAGNOSIS — K2970 Gastritis, unspecified, without bleeding: Secondary | ICD-10-CM | POA: Diagnosis not present

## 2020-05-23 DIAGNOSIS — R0989 Other specified symptoms and signs involving the circulatory and respiratory systems: Secondary | ICD-10-CM

## 2020-05-23 DIAGNOSIS — K208 Other esophagitis without bleeding: Secondary | ICD-10-CM | POA: Diagnosis not present

## 2020-05-23 DIAGNOSIS — R198 Other specified symptoms and signs involving the digestive system and abdomen: Secondary | ICD-10-CM

## 2020-05-23 DIAGNOSIS — F458 Other somatoform disorders: Secondary | ICD-10-CM | POA: Diagnosis not present

## 2020-05-23 MED ORDER — SODIUM CHLORIDE 0.9 % IV SOLN: 500.0000 mL | Freq: Once | INTRAVENOUS | Status: DC

## 2020-05-23 NOTE — Progress Notes (Signed)
pt tolerated well. VSS. awake and to recovery. Report given to RN. Bite block inserted and removed without trauma. 

## 2020-05-23 NOTE — Patient Instructions (Signed)
YOU HAD AN ENDOSCOPIC PROCEDURE TODAY AT THE [facility]:   Refer to the procedure report that was given to you for any specific questions about what was found during the examination.  If the procedure report does not answer your questions, please call your gastroenterologist to clarify.  If you requested that your care partner not be given the details of your procedure findings, then the procedure report has been included in a sealed envelope for you to review at your convenience later.  YOU SHOULD EXPECT: Some feelings of bloating in the abdomen. Passage of more gas than usual.  Walking can help get rid of the air that was put into your GI tract during the procedure and reduce the bloating. If you had a lower endoscopy (such as a colonoscopy or flexible sigmoidoscopy) you may notice spotting of blood in your stool or on the toilet paper. If you underwent a bowel prep for your procedure, you may not have a normal bowel movement for a few days.  Please Note:  You might notice some irritation and congestion in your nose or some drainage.  This is from the oxygen used during your procedure.  There is no need for concern and it should clear up in a day or so.  SYMPTOMS TO REPORT IMMEDIATELY:    Following upper endoscopy (EGD)  Vomiting of blood or coffee ground material  New chest pain or pain under the shoulder blades  Painful or persistently difficult swallowing  New shortness of breath  Fever of 100F or higher  Black, tarry-looking stools  For urgent or emergent issues, a gastroenterologist can be reached at any hour by calling [phone]. Do not use MyChart messaging for urgent concerns.    DIET:  We do recommend a small meal at first, but then you may proceed to your regular diet.  Drink plenty of fluids but you should avoid alcoholic beverages for 24 hours.  ACTIVITY:  You should plan to take it easy for the rest of today and you should NOT DRIVE or use heavy machinery  until tomorrow (because of the sedation medicines used during the test).    FOLLOW UP: Our staff will call the number listed on your records 48-72 hours following your procedure to check on you and address any questions or concerns that you may have regarding the information given to you following your procedure. If we do not reach you, we will leave a message.  We will attempt to reach you two times.  During this call, we will ask if you have developed any symptoms of COVID 19. If you develop any symptoms (ie: fever, flu-like symptoms, shortness of breath, cough etc.) before then, please call [phone].  If you test positive for Covid 19 in the 2 weeks post procedure, please call and report this information to us.    If any biopsies were taken you will be contacted by phone or by letter within the next 1-3 weeks.  Please call us at [phone] if you have not heard about the biopsies in 3 weeks.    SIGNATURES/CONFIDENTIALITY: You and/or your care partner have signed paperwork which will be entered into your electronic medical record.  These signatures attest to the fact that that the information above on your After Visit Summary has been reviewed and is understood.  Full responsibility of the confidentiality of this discharge information lies with you and/or your care-partner. 

## 2020-05-23 NOTE — Progress Notes (Signed)
Called to room to assist during endoscopic procedure.  Patient ID and intended procedure confirmed with present staff. Received instructions for my participation in the procedure from the performing physician.  

## 2020-05-23 NOTE — Progress Notes (Signed)
Medical history reviewed with no changes noted. VS assessed by C.W 

## 2020-05-23 NOTE — Op Note (Signed)
[facility] Patient Name: Madison Esparza Procedure Date: 05/23/2020 10:04 AM MRN: [id number] Endoscopist: Tressia Danas MD, MD Age: 49 Referring MD:  Date of Birth: Mar 12, 1971 Gender: Female Account #: [id number] Procedure:                Upper GI endoscopy Indications:              Globus - present despite resolution of GERD symptoms                           GERD improved on pantoprazole Medicines:                Monitored Anesthesia Care Procedure:                Pre-Anesthesia Assessment:                           - Prior to the procedure, a History and Physical                            was performed, and patient medications and                            allergies were reviewed. The patient's tolerance of                            previous anesthesia was also reviewed. The risks                            and benefits of the procedure and the sedation                            options and risks were discussed with the patient.                            All questions were answered, and informed consent                            was obtained. Prior Anticoagulants: The patient has                            taken no previous anticoagulant or antiplatelet                            agents. ASA Grade Assessment: II - A patient with                            mild systemic disease. After reviewing the risks                            and benefits, the patient was deemed in                            satisfactory condition to undergo the procedure.  After obtaining informed consent, the endoscope was                            passed under direct vision. Throughout the                            procedure, the patient's blood pressure, pulse, and                            oxygen saturations were monitored continuously. The                            Endoscope was introduced through the mouth, and                            advanced to the third  part of duodenum. The upper                            GI endoscopy was accomplished without difficulty.                            The patient tolerated the procedure well. Scope In: Scope Out: Findings:                 The examined esophagus was normal. Biopsies were                            taken from the mid/proximal and distal esophagus                            with a cold forceps for histology. Estimated blood                            loss was minimal.                           The entire examined stomach was normal.                           The examined duodenum was normal.                           The cardia and gastric fundus were normal on                            retroflexion.                           The exam was otherwise without abnormality. Complications:            No immediate complications. Estimated blood loss:                            Minimal. Estimated Blood Loss:     Estimated blood loss was minimal. Impression:               -  Normal esophagus. Biopsied.                           - Normal stomach.                           - Normal examined duodenum.                           - The examination was otherwise normal. Recommendation:           - Patient has a contact number available for                            emergencies. The signs and symptoms of potential                            delayed complications were discussed with the                            patient. Return to normal activities tomorrow.                            Written discharge instructions were provided to the                            patient.                           - Resume previous diet.                           - Continue present medications.                           - Await pathology results.                           - Please call the office to schedule a follow-up                            appointment. Tressia Danas MD, MD 05/23/2020 10:26:19 AM This report  has been signed electronically.

## 2020-05-25 ENCOUNTER — Telehealth: Payer: Self-pay | Admitting: *Deleted

## 2020-05-25 NOTE — Telephone Encounter (Signed)
First attempt, left VM.  

## 2020-05-25 NOTE — Telephone Encounter (Signed)
Left message on f/u call 

## 2020-07-09 ENCOUNTER — Ambulatory Visit: Payer: BC Managed Care – PPO | Admitting: Gastroenterology

## 2020-07-18 ENCOUNTER — Other Ambulatory Visit: Payer: Self-pay | Admitting: Obstetrics and Gynecology

## 2020-07-18 ENCOUNTER — Other Ambulatory Visit: Payer: BC Managed Care – PPO

## 2020-07-18 DIAGNOSIS — Z20822 Contact with and (suspected) exposure to covid-19: Secondary | ICD-10-CM

## 2020-07-19 LAB — SARS-COV-2, NAA 2 DAY TAT

## 2020-07-19 LAB — NOVEL CORONAVIRUS, NAA: SARS-CoV-2, NAA: NOT DETECTED

## 2020-07-20 ENCOUNTER — Other Ambulatory Visit: Payer: Self-pay | Admitting: Obstetrics and Gynecology

## 2020-07-20 DIAGNOSIS — N644 Mastodynia: Secondary | ICD-10-CM

## 2020-07-25 ENCOUNTER — Other Ambulatory Visit: Payer: Self-pay

## 2020-07-25 ENCOUNTER — Ambulatory Visit: Payer: BC Managed Care – PPO | Admitting: Podiatry

## 2020-07-25 DIAGNOSIS — N923 Ovulation bleeding: Secondary | ICD-10-CM

## 2020-07-25 DIAGNOSIS — B351 Tinea unguium: Secondary | ICD-10-CM

## 2020-07-25 DIAGNOSIS — E559 Vitamin D deficiency, unspecified: Secondary | ICD-10-CM | POA: Insufficient documentation

## 2020-07-25 DIAGNOSIS — E7800 Pure hypercholesterolemia, unspecified: Secondary | ICD-10-CM | POA: Insufficient documentation

## 2020-07-25 DIAGNOSIS — J301 Allergic rhinitis due to pollen: Secondary | ICD-10-CM | POA: Insufficient documentation

## 2020-07-25 HISTORY — DX: Ovulation bleeding: N92.3

## 2020-07-25 MED ORDER — TERBINAFINE HCL 250 MG PO TABS: ORAL_TABLET | ORAL | 0 refills | Status: DC

## 2020-07-25 NOTE — Progress Notes (Signed)
Subjective:   Patient ID: Madison Esparza, female   DOB: 50 y.o.   MRN: [id number]   HPI Patient presents stating she started to develop some discoloration on her right big toenail and she is concerned about fungus and did extremely well with laser 6 years ago.  She would like to pursue the same kind of treatment plan is prior so it does not get bad and be more of a problem for her.  She does not smoke likes to be active   Review of Systems  All other systems reviewed and are negative.       Objective:  Physical Exam Vitals and nursing note reviewed.  Constitutional:      Appearance: She is well-developed and well-nourished.  Cardiovascular:     Pulses: Intact distal pulses.  Pulmonary:     Effort: Pulmonary effort is normal.  Musculoskeletal:        General: Normal range of motion.  Skin:    General: Skin is warm.  Neurological:     Mental Status: She is alert.     Neurovascular status was found to be intact muscle strength adequate range of motion adequate.  Right hallux lateral border shows some yellow discoloration and slightly around the hallux nails bilateral with localized yellowness present.  There is good digital perfusion well oriented x3     Assessment:  Localized mycotic infection of the right hallux lateral border     Plan:  H&P reviewed condition and I recommended laser treatment along with a pulse Lamisil and she wants to pursue this route.  Educated her on the scheduled for what I think will only be 1-2 laser treatments along with oral medication that she will start

## 2020-07-26 ENCOUNTER — Other Ambulatory Visit: Payer: Self-pay | Admitting: Family Medicine

## 2020-07-26 ENCOUNTER — Other Ambulatory Visit: Payer: Self-pay

## 2020-07-31 ENCOUNTER — Encounter: Payer: Self-pay | Admitting: Gastroenterology

## 2020-07-31 ENCOUNTER — Other Ambulatory Visit: Payer: Self-pay

## 2020-07-31 ENCOUNTER — Ambulatory Visit: Payer: BC Managed Care – PPO | Admitting: Gastroenterology

## 2020-07-31 VITALS — BP 120/78 | HR 75 | Ht 61.0 in | Wt 153.0 lb

## 2020-07-31 DIAGNOSIS — R140 Abdominal distension (gaseous): Secondary | ICD-10-CM | POA: Diagnosis not present

## 2020-07-31 DIAGNOSIS — R1011 Right upper quadrant pain: Secondary | ICD-10-CM

## 2020-07-31 NOTE — Progress Notes (Signed)
Following message sent to Lesly Rubenstein and April Pait:  RADIOLOGY Wilkie Aye  [facility] Phone: [phone] Fax: [phone]   Patient Name: Madison Esparza DOB: 1970/10/06 MRN #: [id number]  Imaging Ordered: HIDA  Diagnosis: RUQ abd pain, bloating  Ordering Provider: Dr. Orvan Falconer  Is a Prior Authorization needed? We are in the process of obtaining it now  Is the patient Diabetic? No  Does the patient have Hypertension? No  Does the patient have any implanted devices or hardware? No  Date of last BUN/Creat, if needed? N/A  Patient Weight? 153#  Is the patient able to get on the table? Yes  Has the patient been diagnosed with COVID? No  Is the patient waiting on COVID testing results? No  Thank you for your assistance! [facility]

## 2020-07-31 NOTE — Progress Notes (Signed)
Referring Provider: Orland Mustard, MD Primary Care Physician:  Orland Mustard, MD  Chief Complaint:  Digestion issues, feels like something is stuck in the throat, hiatal hernia   IMPRESSION:  Epigastric pain and bloating not improved on PPI therapy Biopsy-proved reflux, improved on pantoprazole    - no eosinophilic esophagitis Globus, resolved on PPI BID Cholelithiasis, seen on ultrasound  Hiatal hernia (per patient report) Suspected fatty liver on ultrasound with normal liver enzymes Anal fistula with patient reported normal colonoscopy 5 years ago in [place] History of iron deficiency anemia    - labs 08/24/19 show normal iron, ferritin, hemoglobin but persistent microcytosis  Globus: Resolved on PPI BID.  Epigastric pain, bloating, and flatus: Not improved with PPI therapy. No obvious source on EGD. Reviewed procedure and pathology results with the patient today.  Will plan HIDA to evaluate for symptomatic gallbladder disease. Differential also includes gastroparesis, altered accomodation, SIBO, and functional dyspepsia.   History of anal fistuta: Obtained records from [facility] in [place] but they did not include details about an anal fistula.   Colon cancer screening: Needs colonoscopy in 4-5 years given her history. Will continue to work to obtain those results.     PLAN: Continue pantoprazole 40 mg BID HIDA with CCK Continue to avoid raw vegetables given the improved symptom control  HPI: Madison Esparza is a 50 y.o. female who returns in follow-up.  The interval history is obtained through the patient and review of her electronic health record.  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   Longstanding history of sensitive stomach. Diagnosed clinically with a hiatal hernia on presentation to the ED 7-8 years ago for atypical chest pain. She was not treated with prescription medications and  started Nexium OTC.   Colonoscopy 5 years ago in [place] for an anal fistula. Physician who performed the exam had his license revoked and she has been unable to obtain and records from that evaluation. No known history of polyps.   Seen by Dr. Artis Flock 08/2019 with concerns for worsening reflux. Abdominal ultrasound at that time showed cholelithiasis without biliary dilatation and suspected fatty liver.  Reflux symptoms improved on prescription pantoprazole. However, she continued to have bloating and epigastric pain. Symptoms are worse in the evening.  In June she developed globus sensation present when awake.   Red wine is her escape and she hopes that she doesn't have to give it up.   Had the ultrasound in March as mentioned above. No other abdominal imaging. Colonoscopy 5 years ago.  Labs 08/24/19 show a normal CMP, iron 96, ferritin 213, WBC 3.6, hgb 12.1, MCV 76.1, RDW 16, platelets 247  EGD 05/23/20 showed a normal exam. Esophageal biopsies showed reflux. There was no EOE, inlet patch or intestinal metaplasia.   She returns today after being on pantoprazole 40 mg BID without any breakthrough symptoms. Globus has resolved. Primary concern is bloating and epigastric abdominal pain that is most severe in the evening. Associated flatus.  She is avoiding high-acid foods. Continues to follow a keto diet.   She has identified raw vegetables as a trigger. Eating cooked vegetables.   Past Medical History:  Diagnosis Date  . Allergy   . Anemia    IDA  . Anxiety   . Hyperlipidemia     Past Surgical History:  Procedure Laterality Date  . COLONOSCOPY     5 years ago  . DILATION AND CURETTAGE  OF UTERUS      Current Outpatient Medications  Medication Sig Dispense Refill  . B Complex-C-Folic Acid (SUPER B COMPLEX/FA/VIT C) TABS Take 1 tablet by mouth daily.    . Cholecalciferol (VITAMIN D3) 50 MCG (2000 UT) capsule Take 2,000 Units by mouth daily.    Marland Kitchen docusate sodium (COLACE) 100 MG capsule Take  100 mg by mouth daily.    . fluticasone (FLONASE) 50 MCG/ACT nasal spray Use 1 spray(s) in each nostril once daily 16 g 0  . gabapentin (NEURONTIN) 300 MG capsule Take 1 capsule (300 mg total) by mouth 2 (two) times daily. 60 capsule 3  . Iron-Vitamin C (VITRON-C) 65-125 MG TABS Take 1 tablet by mouth daily.    Marland Kitchen MAGNESIUM PO Take 1 tablet by mouth daily.    . pantoprazole (PROTONIX) 40 MG tablet TAKE 1 TABLET BY MOUTH TWICE DAILY BEFORE  A  MEAL 180 tablet 0  . rosuvastatin (CRESTOR) 10 MG tablet Take 1 tablet by mouth once daily 90 tablet 3   No current facility-administered medications for this visit.    Allergies as of 07/31/2020 - Review Complete 07/31/2020  Allergen Reaction Noted  . Motrin [ibuprofen] Nausea Only 06/30/2018      Physical Exam: General:   Alert,  well-nourished, pleasant and cooperative in NAD Head:  Normocephalic and atraumatic. Eyes:  Sclera clear, no icterus.   Conjunctiva pink. Abdomen:  Soft, nontender, nondistended, normal bowel sounds, no rebound or guarding. No hepatosplenomegaly.   Rectal:  Deferred  Msk:  Symmetrical. No boney deformities LAD: No inguinal or umbilical LAD Extremities:  No clubbing or edema. Neurologic:  Alert and  oriented x4;  grossly nonfocal Skin:  Intact without significant lesions or rashes. Psych:  Alert and cooperative. Normal mood and affect.    Madison Esparza L. Orvan Falconer, MD, MPH 07/31/2020, 3:42 PM

## 2020-07-31 NOTE — Patient Instructions (Addendum)
It was a pleasure to see you today. Based on our discussion, I am providing you with my recommendations below:   RECOMMENDATION(S):   I am recommending a HIDA scan to further evaluate symptoms.  IMAGING:  . You will be contacted by [facility] Scheduling (Your caller ID will indicate phone # [phone]) within the next business 2 days to schedule your HIDA Scan. If you have not heard from them within 2 business days, please call [facility] Scheduling at [phone] to follow up on the status of your appointment.    BMI:  . If you are age 8 or younger, your body mass index should be between 19-25. Your There is no height or weight on file to calculate BMI. If this is out of the aformentioned range listed, please consider follow up with your Primary Care Provider.   Thank you for trusting me with your gastrointestinal care!    Tressia Danas, MD, MPH

## 2020-08-06 ENCOUNTER — Telehealth: Payer: Self-pay

## 2020-08-06 NOTE — Telephone Encounter (Signed)
Pt has not yet scheduled HIDA. Called and LVM providing pt with the phone #[phone] to schedule her appt.

## 2020-08-07 NOTE — Telephone Encounter (Signed)
Appointment Information  Name: Adela, Esteban MRN: [id number]  Date: 09/06/2020 Status: Sch  Time: 8:00 AM Length: 180  Visit Type: NM HEPATO W/ EF [[id number]] Copay: $0.00  Provider: Kirke Shaggy 2 Department: [facility] MEDICINE  Referring Provider: Tressia Danas CSN: [id number]  Notes: pt arrive at 730 @ [facility]  Made On: 08/06/2020 5:00 PM By: Sheilah Mins

## 2020-08-07 NOTE — Progress Notes (Signed)
Appointment Information  Name: Esparza, Madison C MRN: [id number]  Date: 09/06/2020 Status: Sch  Time: 8:00 AM Length: 180  Visit Type: NM HEPATO W/ EF [[id number]] Copay: $0.00  Provider: [facility] 2 Department: [facility]  Referring Provider: BEAVERS, KIMBERLY CSN: [id number]  Notes: pt arrive at 730 @ [facility]  Made On: 08/06/2020 5:00 PM By: TATUM, GINGER    

## 2020-08-13 ENCOUNTER — Other Ambulatory Visit: Payer: Self-pay

## 2020-08-13 ENCOUNTER — Ambulatory Visit (INDEPENDENT_AMBULATORY_CARE_PROVIDER_SITE_OTHER): Payer: BC Managed Care – PPO

## 2020-08-13 DIAGNOSIS — B351 Tinea unguium: Secondary | ICD-10-CM

## 2020-08-13 NOTE — Patient Instructions (Signed)

## 2020-08-13 NOTE — Progress Notes (Signed)
Patient presents today for the 1st laser treatment. Diagnosed with mycotic nail infection by Dr. Charlsie Merles.    Toenail most affected right hallux.  All other systems are negative.  Nails were filed thin. Laser therapy was administered to right 1st toenails and patient tolerated the treatment well. All safety precautions were in place.   Currently taking Lamisil orally on a pulse schedule.   Follow up in 4 weeks for laser # 2.  Please take a picture of nails at next visit to document visual progress

## 2020-08-22 ENCOUNTER — Other Ambulatory Visit: Payer: Self-pay | Admitting: Family Medicine

## 2020-09-04 ENCOUNTER — Ambulatory Visit
Admission: RE | Admit: 2020-09-04 | Discharge: 2020-09-04 | Disposition: A | Discharge status: Home / Self Care | Payer: BC Managed Care – PPO | Source: Ambulatory Visit | Attending: Obstetrics and Gynecology | Admitting: Obstetrics and Gynecology

## 2020-09-04 ENCOUNTER — Other Ambulatory Visit: Payer: Self-pay

## 2020-09-04 DIAGNOSIS — R922 Inconclusive mammogram: Secondary | ICD-10-CM | POA: Diagnosis not present

## 2020-09-04 DIAGNOSIS — N644 Mastodynia: Secondary | ICD-10-CM | POA: Diagnosis not present

## 2020-09-06 ENCOUNTER — Encounter (HOSPITAL_COMMUNITY)
Admission: RE | Admit: 2020-09-06 | Discharge: 2020-09-06 | Disposition: A | Discharge status: Home / Self Care | Payer: BC Managed Care – PPO | Source: Ambulatory Visit | Attending: Gastroenterology | Admitting: Gastroenterology

## 2020-09-06 ENCOUNTER — Other Ambulatory Visit: Payer: Self-pay

## 2020-09-06 DIAGNOSIS — R1011 Right upper quadrant pain: Secondary | ICD-10-CM

## 2020-09-06 DIAGNOSIS — R140 Abdominal distension (gaseous): Secondary | ICD-10-CM

## 2020-09-06 MED ORDER — TECHNETIUM TC 99M MEBROFENIN IV KIT
5.2000 | PACK | Freq: Once | INTRAVENOUS | Status: AC | PRN
  Administered 2020-09-06: 5.2 via INTRAVENOUS

## 2020-09-10 ENCOUNTER — Ambulatory Visit (INDEPENDENT_AMBULATORY_CARE_PROVIDER_SITE_OTHER): Payer: BC Managed Care – PPO

## 2020-09-10 ENCOUNTER — Other Ambulatory Visit: Payer: Self-pay

## 2020-09-10 DIAGNOSIS — B351 Tinea unguium: Secondary | ICD-10-CM

## 2020-09-10 NOTE — Patient Instructions (Signed)

## 2020-09-10 NOTE — Progress Notes (Signed)
Patient presents today for the 2nd laser treatment. Diagnosed with mycotic nail infection by Dr. Charlsie Merles.    Toenail most affected right hallux.  All other systems are negative.  Nails were filed thin. Laser therapy was administered to right 1st toenails and patient tolerated the treatment well. All safety precautions were in place.   Currently taking Lamisil orally on a pulse schedule.   Follow up in 4 weeks for laser # 3.

## 2020-09-25 ENCOUNTER — Ambulatory Visit: Payer: BC Managed Care – PPO | Admitting: Family Medicine

## 2020-09-25 ENCOUNTER — Encounter: Payer: Self-pay | Admitting: Family Medicine

## 2020-09-25 ENCOUNTER — Other Ambulatory Visit: Payer: Self-pay

## 2020-09-25 VITALS — BP 120/70 | HR 93 | Temp 97.9°F | Wt 156.5 lb

## 2020-09-25 DIAGNOSIS — M545 Low back pain, unspecified: Secondary | ICD-10-CM

## 2020-09-25 DIAGNOSIS — L089 Local infection of the skin and subcutaneous tissue, unspecified: Secondary | ICD-10-CM

## 2020-09-25 MED ORDER — DOXYCYCLINE HYCLATE 100 MG PO TABS: 100.0000 mg | ORAL_TABLET | Freq: Two times a day (BID) | ORAL | 0 refills | Status: DC

## 2020-09-25 NOTE — Progress Notes (Signed)
Established Patient Office Visit  Subjective:  Patient ID: Madison Esparza, female    DOB: [birth year]  Age: 50 y.o. MRN: [id number]  CC:  Chief Complaint  Patient presents with  . Rash    R side of face. L lower back, very painful, rash has been off and on since October, originally told this was a side effect of having the covid vaccine.     HPI Madison Esparza presents for concern for possible shingles issues involving right lower back and left side of face.  She states that back in October she had booster from her COVID-vaccine about 2 weeks later reportedly had shingles involving the right side of the face.  She was at the beach last week with her family.  Last Thursday she noticed some right-sided low back pain.  She applied some ice directly does have some soreness localized to the right low back since then.  Mild redness.  No vesicles.  Back pain slowly improving.  She did yesterday noticed some small erythematous papules left side of face just above the lip.  Couple small pustules.  No vesicles.  These are slightly painful.  Past Medical History:  Diagnosis Date  . Allergy   . Anemia    IDA  . Anxiety   . Hyperlipidemia     Past Surgical History:  Procedure Laterality Date  . COLONOSCOPY     5 years ago  . DILATION AND CURETTAGE OF UTERUS      Family History  Problem Relation Age of Onset  . Breast cancer Cousin   . Arthritis Mother   . Hyperlipidemia Mother   . Miscarriages / [place] Mother   . Arthritis Sister   . Heart attack Sister   . Heart disease Sister   . Heart disease Brother   . Arthritis Maternal Grandmother   . Hearing loss Maternal Grandmother   . Hypertension Maternal Grandmother   . Hearing loss Maternal Grandfather   . Colon cancer Neg Hx   . Esophageal cancer Neg Hx   . Pancreatic cancer Neg Hx   . Stomach cancer Neg Hx   . Liver disease Neg Hx     Social History   Socioeconomic History  . Marital status: Married    Spouse name: Not  on file  . Number of children: Not on file  . Years of education: Not on file  . Highest education level: Not on file  Occupational History  . Not on file  Tobacco Use  . Smoking status: Former Smoker    Types: Cigarettes  . Smokeless tobacco: Never Used  Vaping Use  . Vaping Use: Never used  Substance and Sexual Activity  . Alcohol use: Yes    Alcohol/week: 7.0 standard drinks    Types: 7 Glasses of wine per week  . Drug use: Never  . Sexual activity: Yes    Partners: Male  Other Topics Concern  . Not on file  Social History Narrative  . Not on file   Social Determinants of Health   Financial Resource Strain: Not on file  Food Insecurity: Not on file  Transportation Needs: Not on file  Physical Activity: Not on file  Stress: Not on file  Social Connections: Not on file  Intimate Partner Violence: Not on file    Outpatient Medications Prior to Visit  Medication Sig Dispense Refill  . docusate sodium (COLACE) 100 MG capsule Take 100 mg by mouth daily.    . fluticasone (FLONASE) 50  MCG/ACT nasal spray Use 1 spray(s) in each nostril once daily 16 g 0  . pantoprazole (PROTONIX) 40 MG tablet TAKE 1 TABLET BY MOUTH TWICE DAILY BEFORE  A  MEAL 180 tablet 0  . Probiotic Product (PROBIOTIC-10 PO) Take by mouth.    . rosuvastatin (CRESTOR) 10 MG tablet Take 1 tablet by mouth once daily 90 tablet 3  . Turmeric (QC TUMERIC COMPLEX PO) Take by mouth.    . B Complex-C-Folic Acid (SUPER B COMPLEX/FA/VIT C) TABS Take 1 tablet by mouth daily.    . Cholecalciferol (VITAMIN D3) 50 MCG (2000 UT) capsule Take 2,000 Units by mouth daily.    Marland Kitchen gabapentin (NEURONTIN) 300 MG capsule Take 1 capsule (300 mg total) by mouth 2 (two) times daily. 60 capsule 3  . Iron-Vitamin C (VITRON-C) 65-125 MG TABS Take 1 tablet by mouth daily.    Marland Kitchen MAGNESIUM PO Take 1 tablet by mouth daily.     No facility-administered medications prior to visit.    Allergies  Allergen Reactions  . Motrin [Ibuprofen]  Nausea Only    ROS Review of Systems  Constitutional: Negative for chills and fever.  Skin: Positive for rash.      Objective:    Physical Exam Vitals reviewed.  Constitutional:      Appearance: Normal appearance.  Cardiovascular:     Rate and Rhythm: Normal rate and regular rhythm.  Skin:    Comments: Lower back reveals approximately 3 x 3 cm area of very mild erythema.  No bruising.  No vesicles.  No pustules.  Left side of face just above the lip reveals couple small erythematous papules.  She has a couple small pustules.  No vesicles.  Neurological:     Mental Status: She is alert.     BP 120/70 (BP Location: Left Arm, Patient Position: Sitting, Cuff Size: Normal)   Pulse 93   Temp 97.9 F (36.6 C) (Oral)   Wt 156 lb 8 oz (71 kg)   SpO2 97%   BMI 29.57 kg/m  Wt Readings from Last 3 Encounters:  09/25/20 156 lb 8 oz (71 kg)  07/31/20 153 lb (69.4 kg)  05/23/20 149 lb (67.6 kg)     Health Maintenance Due  Topic Date Due  . Hepatitis C Screening  Never done  . COLONOSCOPY (Pts 45-47yrs Insurance coverage will need to be confirmed)  Never done  . PAP SMEAR-Modifier  01/29/2020  . COVID-19 Vaccine (3 - Booster for Pfizer series) 05/18/2020    There are no preventive care reminders to display for this patient.  Lab Results  Component Value Date   TSH 2.89 08/24/2019   Lab Results  Component Value Date   WBC 3.6 (L) 08/24/2019   HGB 12.1 08/24/2019   HCT 37.3 08/24/2019   MCV 76.1 (L) 08/24/2019   PLT 247.0 08/24/2019   Lab Results  Component Value Date   NA 140 08/24/2019   K 4.6 08/24/2019   CO2 31 08/24/2019   GLUCOSE 92 08/24/2019   BUN 16 08/24/2019   CREATININE 0.62 08/24/2019   BILITOT 0.6 08/24/2019   ALKPHOS 57 08/24/2019   AST 29 08/24/2019   ALT 30 08/24/2019   PROT 7.7 08/24/2019   ALBUMIN 4.5 08/24/2019   CALCIUM 9.8 08/24/2019   GFR 103.05 08/24/2019   Lab Results  Component Value Date   CHOL 237 (H) 08/24/2019   Lab  Results  Component Value Date   HDL 98.40 08/24/2019   Lab Results  Component  Value Date   LDLCALC 121 (H) 08/24/2019   Lab Results  Component Value Date   TRIG 86.0 08/24/2019   Lab Results  Component Value Date   CHOLHDL 2 08/24/2019   No results found for: HGBA1C    Assessment & Plan:   #1 right low back pain.  She has some mild erythema and suspect this may be related to recent icing and possible small superficial thermal burn.  No evidence for shingles. -Reassurance and observation for now  #2 scattered small pustules left side of face.  This does not clinically look like shingles. -Keep off any heavy make-up over the area for now and keep clean with gentle soap and water -Doxycycline 100 mg twice daily for 10 days and review possible side effects including risk of increased sun sensitivity  Meds ordered this encounter  Medications  . doxycycline (VIBRA-TABS) 100 MG tablet    Sig: Take 1 tablet (100 mg total) by mouth 2 (two) times daily.    Dispense:  20 tablet    Refill:  0    Follow-up: No follow-ups on file.    Evelena Peat, MD

## 2020-10-12 ENCOUNTER — Other Ambulatory Visit: Payer: Self-pay

## 2020-10-12 ENCOUNTER — Other Ambulatory Visit: Payer: Self-pay | Admitting: Family Medicine

## 2020-10-12 ENCOUNTER — Ambulatory Visit (INDEPENDENT_AMBULATORY_CARE_PROVIDER_SITE_OTHER): Payer: BC Managed Care – PPO

## 2020-10-12 DIAGNOSIS — B351 Tinea unguium: Secondary | ICD-10-CM

## 2020-10-12 NOTE — Progress Notes (Signed)
Patient presents today for the 3rd laser treatment. Diagnosed with mycotic nail infection by Dr. Charlsie Merles.    Toenail most affected right hallux.  All other systems are negative.  Nails were filed thin. Laser therapy was administered to right 1st toenails and patient tolerated the treatment well. All safety precautions were in place.   Currently taking Lamisil orally on a pulse schedule.   Follow up in 6 weeks for laser # 4.

## 2020-10-22 IMAGING — US US ABDOMEN COMPLETE
1 series · 14 of 25 positions shown · non-contrast
Comparison: None.

CLINICAL DATA: Right upper quadrant pain.

EXAM:
ABDOMEN ULTRASOUND COMPLETE

[Series 1: us abdomen complete · 0.19mm/px · 14 of 88 slices shown]
[im 1/88]
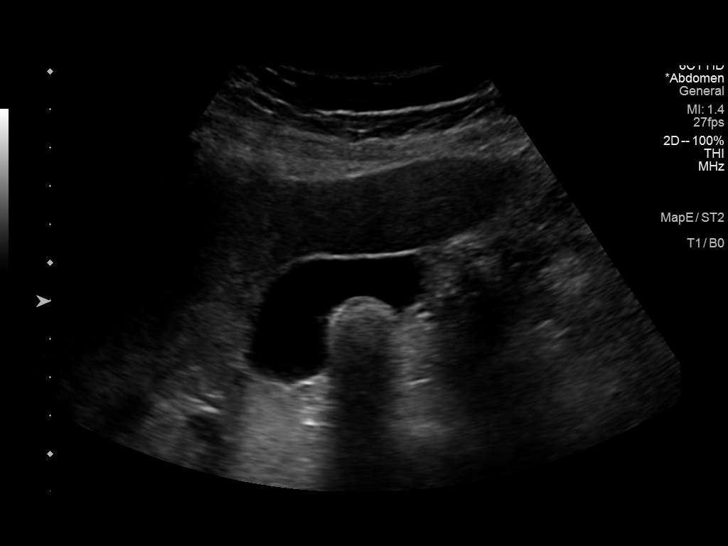
[im 8/88]
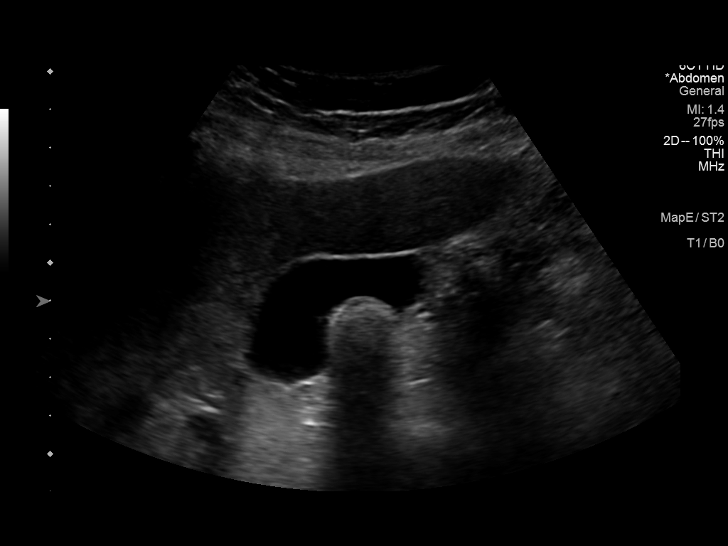
[im 15/88]
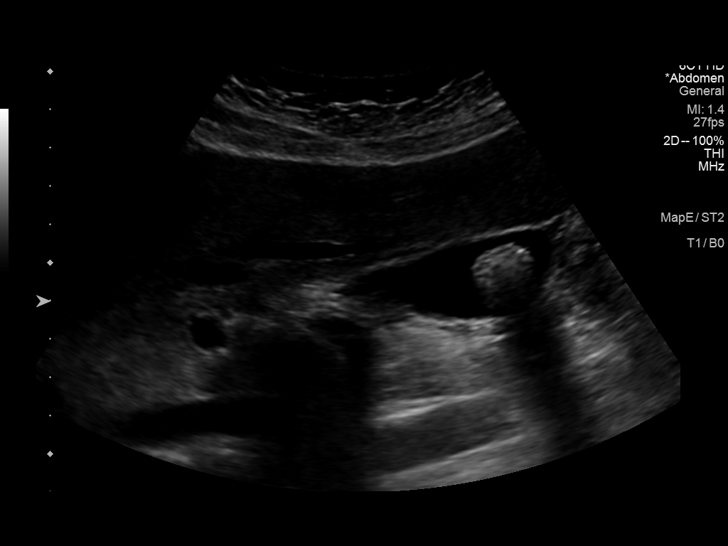
[im 22/88]
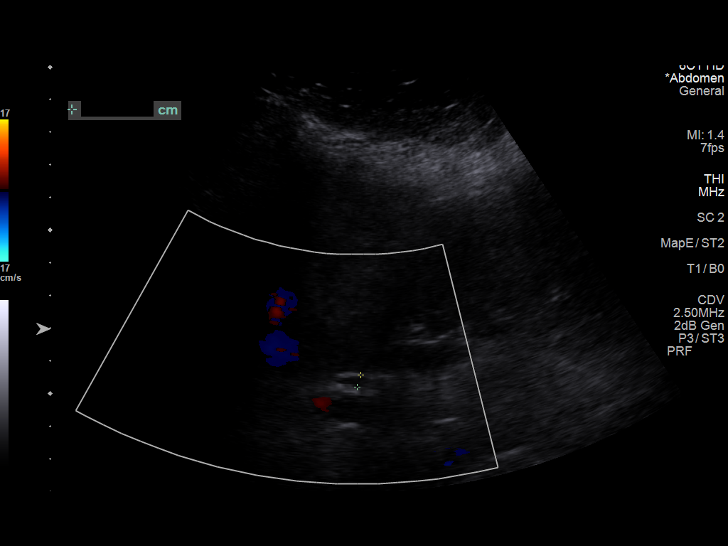
[im 30/88]
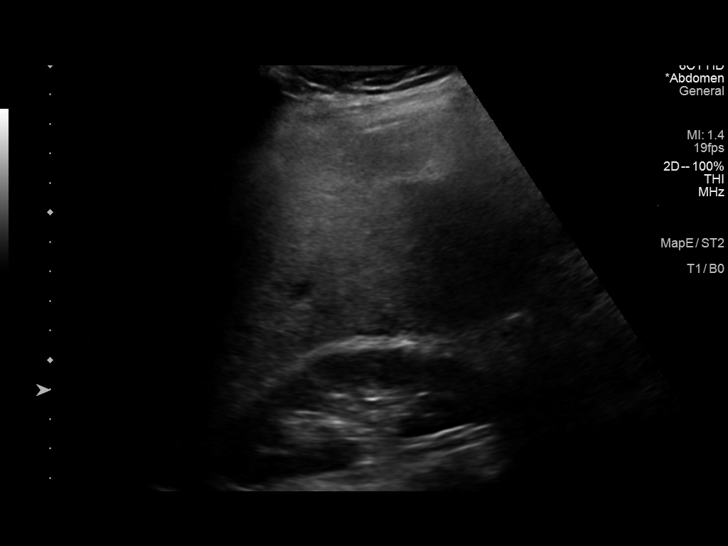
[im 33/88]
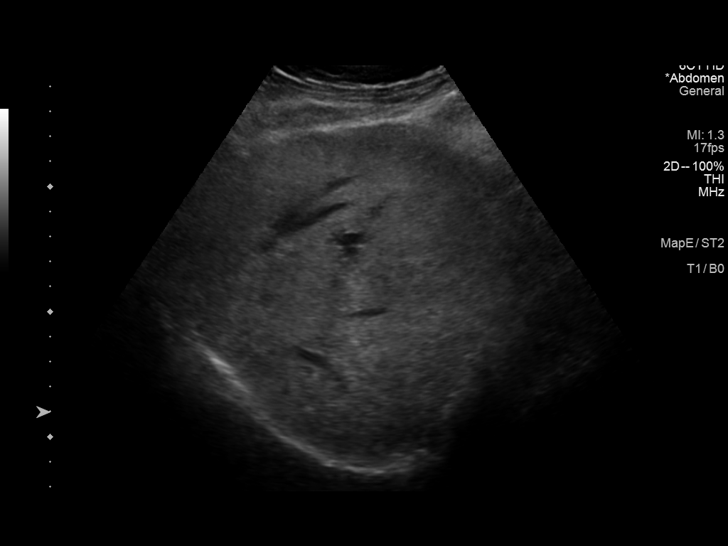
[im 40/88]
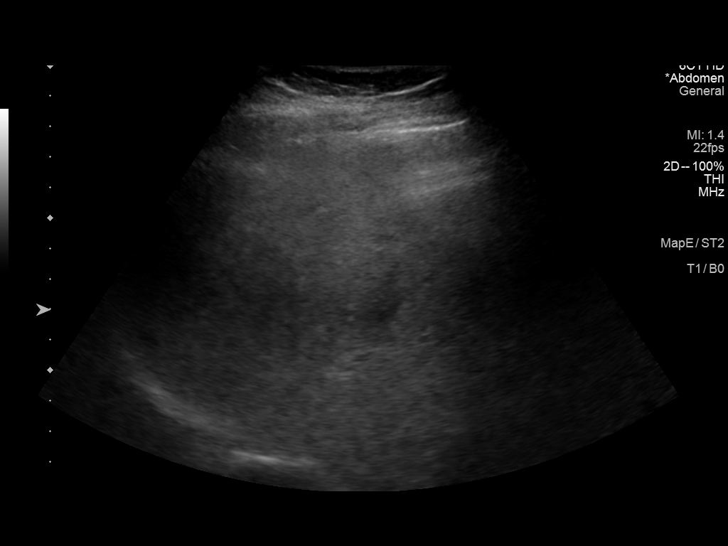
[im 48/88]
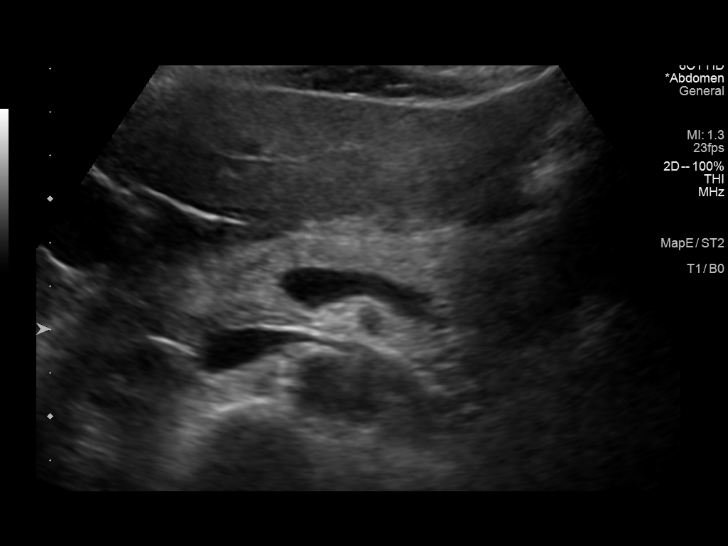
[im 55/88]
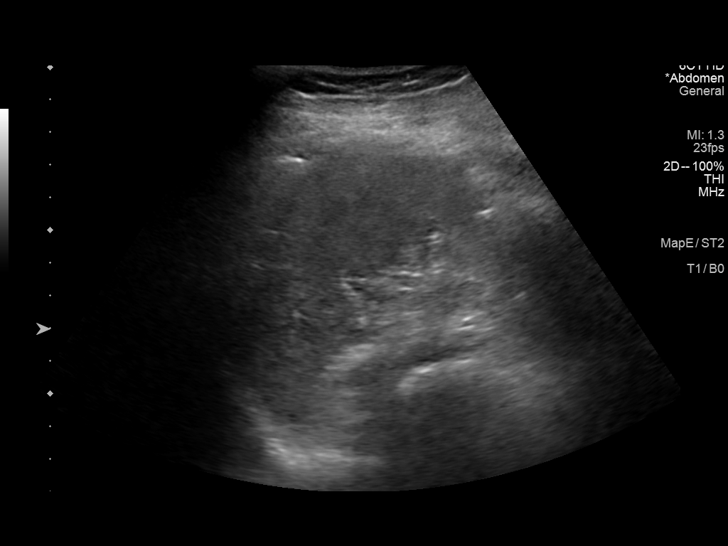
[im 59/88]
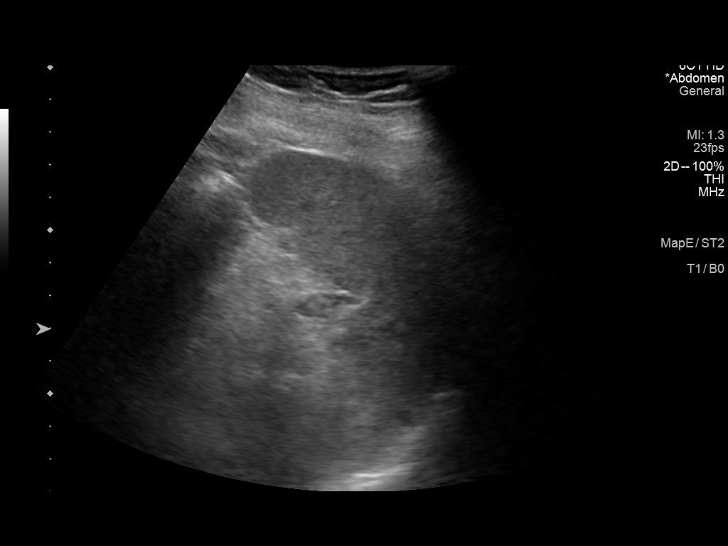
[im 66/88]
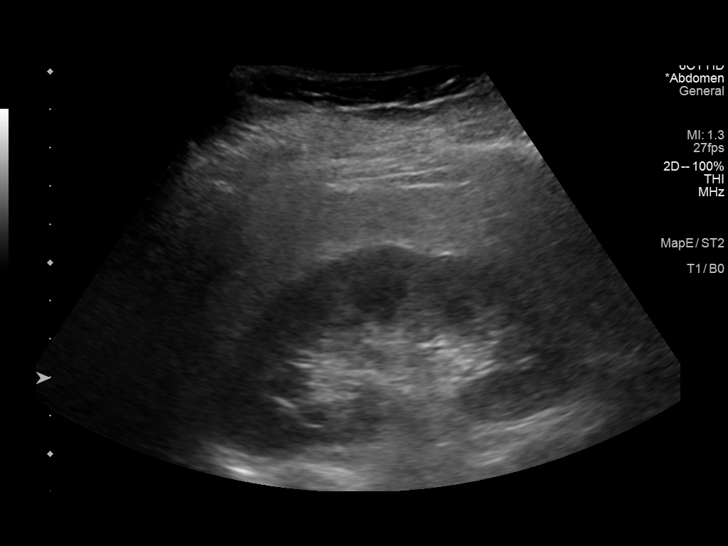
[im 73/88]
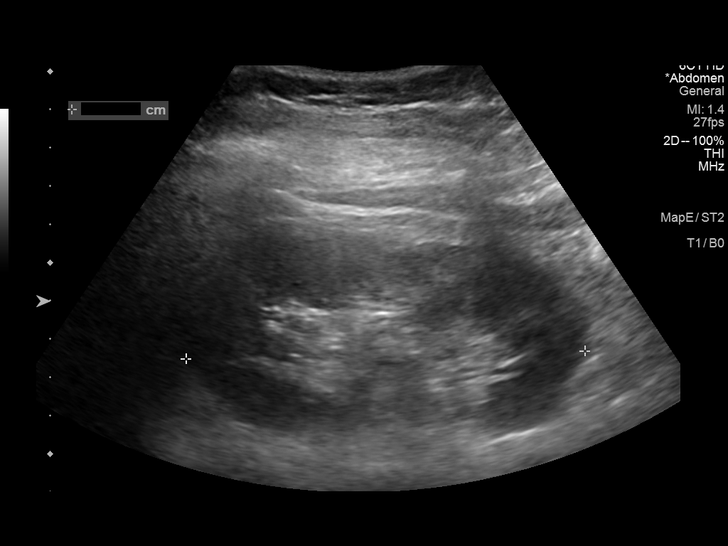
[im 80/88]
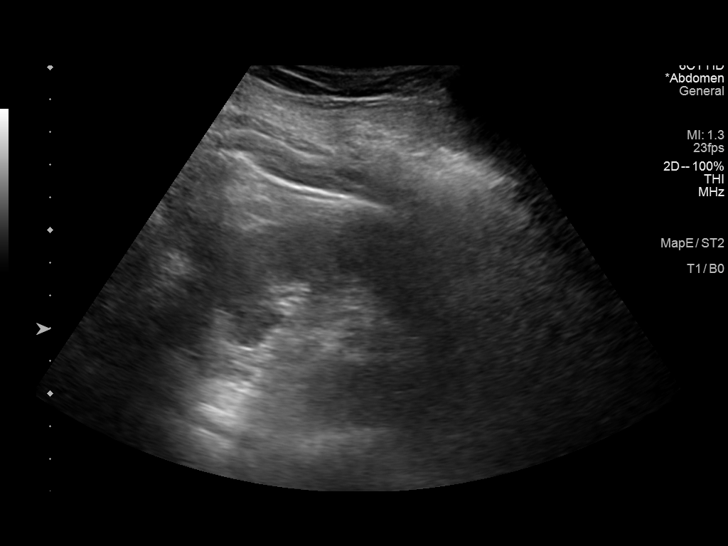
[im 88/88]
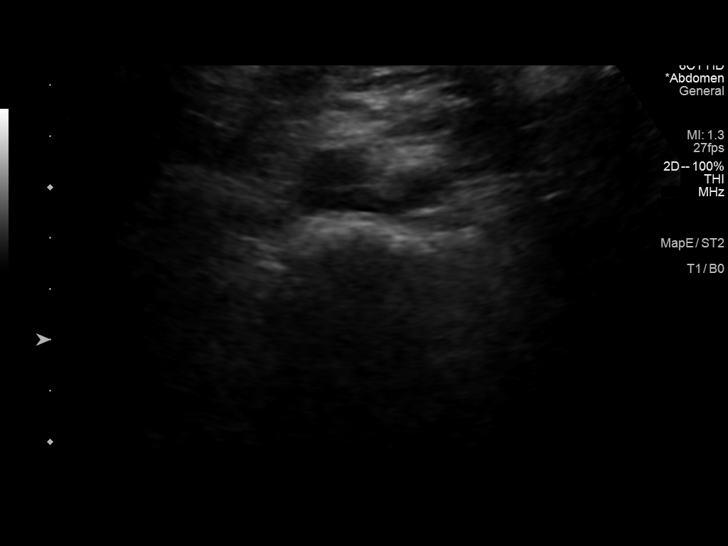

[14 of 25 positions shown; findings below may reference images not displayed]

FINDINGS: Gallbladder: Shadowing gallstone identified measuring 18 mm
diameter. No gallbladder wall thickening or pericholecystic fluid.
Sonographer reports no sonographic Murphy sign.

Common bile duct: Diameter: 4 mm

Liver: Echogenic liver parenchyma suggests fatty deposition. No
focal abnormality identified. Portal vein is patent on color Doppler
imaging with normal direction of blood flow towards the liver.

IVC: No abnormality visualized.

Pancreas: Visualized portion unremarkable.

Spleen: Size and appearance within normal limits.

Right Kidney: Length: 10.0. Echogenicity within normal limits. No
mass or hydronephrosis visualized.

Left Kidney: Length: 10.4. Echogenicity within normal limits. No
mass or hydronephrosis visualized.

Abdominal aorta: No aneurysm visualized.

Other findings: None.
IMPRESSION: 1. Cholelithiasis without biliary dilatation.
2. Increased echogenicity of liver parenchyma suggests steatosis.

## 2020-11-01 ENCOUNTER — Ambulatory Visit: Payer: BC Managed Care – PPO | Admitting: Gastroenterology

## 2020-11-01 ENCOUNTER — Encounter: Payer: Self-pay | Admitting: Gastroenterology

## 2020-11-01 ENCOUNTER — Other Ambulatory Visit: Payer: Self-pay

## 2020-11-01 VITALS — BP 110/70 | HR 88 | Ht 61.0 in | Wt 156.1 lb

## 2020-11-01 DIAGNOSIS — R1011 Right upper quadrant pain: Secondary | ICD-10-CM | POA: Diagnosis not present

## 2020-11-01 DIAGNOSIS — R140 Abdominal distension (gaseous): Secondary | ICD-10-CM

## 2020-11-01 MED ORDER — DICYCLOMINE HCL 20 MG PO TABS: 20.0000 mg | ORAL_TABLET | Freq: Three times a day (TID) | ORAL | 3 refills | Status: DC

## 2020-11-01 NOTE — Patient Instructions (Addendum)
I have recommended a trial of dicyclomine 20 mg four times daily - taken prior to meals.  I am wondering if some of your symptoms are related to fructose intolerance. Fructose is a sugar found naturally in fruits, fruit juices, some vegetables and honey. Fructose is also a basic component in table sugar (sucrose), and high-fructose corn syrup is used to sweeten many processed foods and beverages. When your digestive system doesn't absorb fructose properly, it can cause abdominal pain, diarrhea and gas. People who have fructose intolerance should limit high-fructose foods, such as juices, apples, grapes, watermelon, asparagus, peas and zucchini. Some lower fructose foods -- such as bananas, blueberries, strawberries, carrots, avocados, green beans and lettuce -- may be tolerated in limited quantities with meals.  Read product labels carefully and avoid foods containing: - Fructose - High-fructose corn syrup - Honey - Agave syrup - Invert sugar - Maple-flavored syrup - Molasses - Palm or coconut sugar - Sorghum

## 2020-11-01 NOTE — Progress Notes (Signed)
Referring Provider: Orland Mustard, MD Primary Care Physician:  Orland Mustard, MD  Chief Complaint:  Digestion issues, feels like something is stuck in the throat, hiatal hernia   IMPRESSION:  Epigastric pain and bloating     - not improved on PPI therapy    - slight improvement with FDGard Biopsy-proved reflux, improved on pantoprazole    - no eosinophilic esophagitis Globus, resolved on PPI BID Cholelithiasis, seen on ultrasound  Hiatal hernia (per patient report) Suspected fatty liver on ultrasound with normal liver enzymes Anal fistula with patient reported normal colonoscopy 5 years ago in [place] History of iron deficiency anemia    - labs 08/24/19 show normal iron, ferritin, hemoglobin but persistent microcytosis  Epigastric pain, bloating, and flatus: Not improved with PPI therapy or FDGard despite mild reflux on EGD. No obvious source on EGD, ultrasound, or HIDA. Differential includes altered gastric accomodation, SIBO, and functional dyspepsia. We also discussed the possibility of fructose intolerance.   History of anal fistuta: Obtained records from [facility] in [place] but they did not include details about an anal fistula.   Colon cancer screening: Needs colonoscopy in 4-5 years given her history. Will continue to work to obtain those results.     PLAN: - Continue pantoprazole 40 mg BID - Trial of dicyclomine 20 mg QID - Two weeks minimizing all fructose while keeping a symptoms and food intake diary - Follow-up in 3 months, earlier if needed  HPI: Madison Esparza is a 50 y.o. female who returns in follow-up.  The interval history is obtained through the patient and review of her electronic health record.  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   Longstanding history of sensitive stomach. Diagnosed clinically with a hiatal hernia on presentation to the ED 7-8 years ago for  atypical chest pain. She was not treated with prescription medications and started Nexium OTC.   Colonoscopy 5 years ago in [place] for an anal fistula. Physician who performed the exam had his license revoked and she has been unable to obtain and records from that evaluation. No known history of polyps.   Seen by Dr. Artis Flock 08/2019 with concerns for worsening reflux. Abdominal ultrasound at that time showed cholelithiasis without biliary dilatation and suspected fatty liver.  Reflux symptoms improved on prescription pantoprazole. However, she continued to have bloating and epigastric pain.  In June she developed globus sensation present when awake.  Red wine is her escape and she hopes that she doesn't have to give it up.   Labs 08/24/19 show a normal CMP, iron 96, ferritin 213, WBC 3.6, hgb 12.1, MCV 76.1, RDW 16, platelets 247  EGD 05/23/20 showed mild, biopsy-proven reflux. There was no EOE, inlet patch or intestinal metaplasia.   At the time of her follow-up visit 07/31/20 she reported well-controlled symptoms on pantoprazole 40 mg BID without any breakthrough symptoms. Globus has resolved. Primary concern was bloating and epigastric abdominal pain that is most severe in the evening. Associated flatus.  She was avoiding high-acid foods. She was following a keto diet but found it hard to stick to the diet.   She had identified raw vegetables as a trigger.   HIDA scan with CCK 09/06/2020 was normal with a calculated gallbladder ejection fraction of 80%.  She returns today in scheduled follow-up.  She continues to have epigastric and upper abdominal pain pains particularly when eating raw vegetables and certain fruits.  She  was asked to try FD guard 2 capsules taken twice daily for 4 weeks this may have helped with the bloating but not with the other symptoms. She continue to have alternating diarrhea and constipation. She really want to be able to eat fruits in vegetables and she feels like she is gaining  weight as she avoid those foods.   Past Medical History:  Diagnosis Date  . Allergy   . Anemia    IDA  . Anxiety   . Hyperlipidemia     Past Surgical History:  Procedure Laterality Date  . COLONOSCOPY     5 years ago  . DILATION AND CURETTAGE OF UTERUS      Current Outpatient Medications  Medication Sig Dispense Refill  . docusate sodium (COLACE) 100 MG capsule Take 100 mg by mouth daily.    . fluticasone (FLONASE) 50 MCG/ACT nasal spray Use 1 spray(s) in each nostril once daily 16 g 0  . pantoprazole (PROTONIX) 40 MG tablet TAKE 1 TABLET BY MOUTH TWICE DAILY BEFORE  A  MEAL 180 tablet 0  . Probiotic Product (PROBIOTIC-10 PO) Take by mouth.    . rosuvastatin (CRESTOR) 10 MG tablet Take 1 tablet by mouth once daily 90 tablet 3  . Turmeric (QC TUMERIC COMPLEX PO) Take by mouth.     No current facility-administered medications for this visit.    Allergies as of 11/01/2020 - Review Complete 11/01/2020  Allergen Reaction Noted  . Motrin [ibuprofen] Nausea Only 06/30/2018    Physical Exam: General:   Alert,  well-nourished, pleasant and cooperative in NAD Head:  Normocephalic and atraumatic. Eyes:  Sclera clear, no icterus.   Conjunctiva pink. Abdomen:  Soft, nontender, nondistended, normal bowel sounds, no rebound or guarding. No hepatosplenomegaly. Neurologic:  Alert and  oriented x4;  grossly nonfocal Skin:  Intact without significant lesions or rashes. Psych:  Alert and cooperative. Normal mood and affect.    Delane Wessinger L. Orvan Falconer, MD, MPH 11/01/2020, 2:07 PM

## 2020-12-03 ENCOUNTER — Other Ambulatory Visit: Payer: Self-pay

## 2020-12-03 ENCOUNTER — Other Ambulatory Visit: Payer: Self-pay | Admitting: Family Medicine

## 2021-03-11 ENCOUNTER — Other Ambulatory Visit: Payer: Self-pay | Admitting: Family Medicine

## 2021-03-13 ENCOUNTER — Other Ambulatory Visit: Payer: Self-pay | Admitting: Family Medicine

## 2021-03-20 ENCOUNTER — Other Ambulatory Visit: Payer: Self-pay | Admitting: Family Medicine

## 2021-03-20 ENCOUNTER — Telehealth: Payer: Self-pay

## 2021-03-20 ENCOUNTER — Other Ambulatory Visit: Payer: Self-pay

## 2021-03-20 MED ORDER — ROSUVASTATIN CALCIUM 10 MG PO TABS: 10.0000 mg | ORAL_TABLET | Freq: Every day | ORAL | 0 refills | Status: DC

## 2021-03-20 NOTE — Telephone Encounter (Signed)
Refill sent.

## 2021-03-20 NOTE — Telephone Encounter (Signed)
LAST APPOINTMENT DATE:  03/14/20  NEXT APPOINTMENT DATE: 04/17/21 TOC to Judeth Cornfield  MEDICATION:rosuvastatin (CRESTOR) 10 MG tablet  PHARMACY: [facility] [address], [place] - [address]

## 2021-04-17 ENCOUNTER — Ambulatory Visit (INDEPENDENT_AMBULATORY_CARE_PROVIDER_SITE_OTHER): Payer: BC Managed Care – PPO | Admitting: Family

## 2021-04-17 ENCOUNTER — Other Ambulatory Visit: Payer: Self-pay

## 2021-04-17 ENCOUNTER — Encounter: Payer: Self-pay | Admitting: Family

## 2021-04-17 VITALS — BP 124/80 | HR 88 | Temp 98.4°F | Ht 61.0 in | Wt 155.0 lb

## 2021-04-17 DIAGNOSIS — B0229 Other postherpetic nervous system involvement: Secondary | ICD-10-CM

## 2021-04-17 DIAGNOSIS — Z0000 Encounter for general adult medical examination without abnormal findings: Secondary | ICD-10-CM

## 2021-04-17 DIAGNOSIS — Z0001 Encounter for general adult medical examination with abnormal findings: Secondary | ICD-10-CM

## 2021-04-17 DIAGNOSIS — E782 Mixed hyperlipidemia: Secondary | ICD-10-CM

## 2021-04-17 DIAGNOSIS — E559 Vitamin D deficiency, unspecified: Secondary | ICD-10-CM | POA: Diagnosis not present

## 2021-04-17 HISTORY — DX: Encounter for general adult medical examination without abnormal findings: Z00.00

## 2021-04-17 LAB — COMPREHENSIVE METABOLIC PANEL
ALT: 20 U/L (ref 0–35)
AST: 25 U/L (ref 0–37)
Albumin: 4.5 g/dL (ref 3.5–5.2)
Alkaline Phosphatase: 58 U/L (ref 39–117)
BUN: 17 mg/dL (ref 6–23)
CO2: 30 mEq/L (ref 19–32)
Calcium: 9.6 mg/dL (ref 8.4–10.5)
Chloride: 102 mEq/L (ref 96–112)
Creatinine, Ser: 0.65 mg/dL (ref 0.40–1.20)
GFR: 103.74 mL/min (ref >60.00)
Glucose, Bld: 92 mg/dL (ref 70–99)
Potassium: 3.8 mEq/L (ref 3.5–5.1)
Sodium: 138 mEq/L (ref 135–145)
Total Bilirubin: 0.7 mg/dL (ref 0.2–1.2)
Total Protein: 7.5 g/dL (ref 6.0–8.3)

## 2021-04-17 LAB — CBC WITH DIFFERENTIAL/PLATELET
Basophils Absolute: 0 10*3/uL (ref 0.0–0.1)
Basophils Relative: 0.4 % (ref 0.0–3.0)
Eosinophils Absolute: 0.1 10*3/uL (ref 0.0–0.7)
Eosinophils Relative: 1.3 % (ref 0.0–5.0)
HCT: 34.1 % — ABNORMAL LOW (ref 36.0–46.0)
Hemoglobin: 11.1 g/dL — ABNORMAL LOW (ref 12.0–15.0)
Lymphocytes Relative: 31.1 % (ref 12.0–46.0)
Lymphs Abs: 1.5 10*3/uL (ref 0.7–4.0)
MCHC: 32.4 g/dL (ref 30.0–36.0)
MCV: 74.3 fl — ABNORMAL LOW (ref 78.0–100.0)
Monocytes Absolute: 0.4 10*3/uL (ref 0.1–1.0)
Monocytes Relative: 8.2 % (ref 3.0–12.0)
Neutro Abs: 2.8 10*3/uL (ref 1.4–7.7)
Neutrophils Relative %: 59 % (ref 43.0–77.0)
Platelets: 250 10*3/uL (ref 150.0–400.0)
RBC: 4.59 Mil/uL (ref 3.87–5.11)
RDW: 16.2 % — ABNORMAL HIGH (ref 11.5–15.5)
WBC: 4.7 10*3/uL (ref 4.0–10.5)

## 2021-04-17 LAB — LIPID PANEL
Cholesterol: 198 mg/dL (ref 0–200)
HDL: 85.4 mg/dL (ref >39.00)
LDL Cholesterol: 90 mg/dL (ref 0–99)
NonHDL: 112.47
Total CHOL/HDL Ratio: 2
Triglycerides: 111 mg/dL (ref 0.0–149.0)
VLDL: 22.2 mg/dL (ref 0.0–40.0)

## 2021-04-17 LAB — TSH: TSH: 1.42 u[IU]/mL (ref 0.35–5.50)

## 2021-04-17 LAB — VITAMIN D 25 HYDROXY (VIT D DEFICIENCY, FRACTURES): VITD: 25.01 ng/mL — ABNORMAL LOW (ref 30.00–100.00)

## 2021-04-17 MED ORDER — GABAPENTIN 300 MG PO CAPS: 300.0000 mg | ORAL_CAPSULE | Freq: Two times a day (BID) | ORAL | 1 refills | Status: DC

## 2021-04-17 MED ORDER — ROSUVASTATIN CALCIUM 10 MG PO TABS: 10.0000 mg | ORAL_TABLET | Freq: Every day | ORAL | 1 refills | Status: DC

## 2021-04-17 NOTE — Assessment & Plan Note (Signed)
Mammogram recently, PAP due, pt has appt with GYN.

## 2021-04-17 NOTE — Assessment & Plan Note (Signed)
Shingles breakout last year with rash on her face, not around eyes, pain had improved, but now feeling the tingling and burning pain on left side of face/jaw, sending refill of Gabapentin.

## 2021-04-17 NOTE — Progress Notes (Signed)
Phone [phone]   Subjective:   Patient is a 50yo female presenting for annual physical. Chief Complaint  Patient presents with   Annual Exam   Hyperlipidemia   Vitamin D Deficiency   Facial Pain    Left side; Due to shingles more than a year ago. She is currently taking Gabapentin.    See problem oriented charting- ROS- full  review of systems was completed and negative except for: Hyperlipidemia, post-herpetic facial pain.  HPI: Pain She reports recurrent facial pain. was an infection that may have caused the pain. The pain started about a year ago and is gradually worsening. The pain does not radiate. The pain is described as burning and tingling, occurring intermittently. Symptoms are worse in the: all day.  Aggravating factors: none Relieving factors: none.  She has tried NSAIDs with little relief. Pt reports relief with Gabapentin bid.  ---------------------------------------------------------------------------------------------------  Hyperlipidemia: Patient is currently maintained on the following medication for hyperlipidemia: Crestor. Patient denies myalgia or other side effects. Patient reports good compliance with low fat/low cholesterol diet.  Last lipid panel as follows: Lab Results  Component Value Date   CHOL 237 (H) 08/24/2019   HDL 98.40 08/24/2019   LDLCALC 121 (H) 08/24/2019   TRIG 86.0 08/24/2019   CHOLHDL 2 08/24/2019   The 10-year ASCVD risk score (Arnett DK, et al., 2019) is: 0.6%   Values used to calculate the score:     Age: 70 years     Sex: Female     Is Non-Hispanic African American: No     Diabetic: No     Tobacco smoker: No     Systolic Blood Pressure: 124 mmHg     Is BP treated: No     HDL Cholesterol: 98.4 mg/dL     Total Cholesterol: 237 mg/dL   The following were reviewed and entered/updated in epic: Past Medical History:  Diagnosis Date   Allergy    Anemia    IDA   Anxiety    Hyperlipidemia    Patient Active Problem  List   Diagnosis Date Noted   Neuralgia, postherpetic 04/17/2021   Annual physical exam 04/17/2021   Allergic rhinitis due to pollen 07/25/2020   Intermenstrual bleeding 07/25/2020   Pure hypercholesterolemia 07/25/2020   Vitamin D deficiency 07/25/2020   Hiatal hernia 08/10/2019   Iron deficiency anemia 07/05/2018   Hyperlipidemia 06/30/2018   Past Surgical History:  Procedure Laterality Date   COLONOSCOPY     5 years ago   DILATION AND CURETTAGE OF UTERUS      Family History  Problem Relation Age of Onset   Breast cancer Cousin    Arthritis Mother    Hyperlipidemia Mother    Miscarriages / [place] Mother    Arthritis Sister    Heart attack Sister    Heart disease Sister    Heart disease Brother    Arthritis Maternal Grandmother    Hearing loss Maternal Grandmother    Hypertension Maternal Grandmother    Hearing loss Maternal Grandfather    Colon cancer Neg Hx    Esophageal cancer Neg Hx    Pancreatic cancer Neg Hx    Stomach cancer Neg Hx    Liver disease Neg Hx     Medications- reviewed and updated Current Outpatient Medications  Medication Sig Dispense Refill   dicyclomine (BENTYL) 20 MG tablet Take 1 tablet (20 mg total) by mouth 4 (four) times daily -  before meals and at bedtime. 120 tablet 3   docusate  sodium (COLACE) 100 MG capsule Take 100 mg by mouth daily.     fluticasone (FLONASE) 50 MCG/ACT nasal spray Use 1 spray(s) in each nostril once daily 16 g 0   pantoprazole (PROTONIX) 40 MG tablet TAKE 1 TABLET BY MOUTH TWICE DAILY BEFORE A MEAL 180 tablet 0   gabapentin (NEURONTIN) 300 MG capsule Take 1 capsule (300 mg total) by mouth 2 (two) times daily. 180 capsule 1   rosuvastatin (CRESTOR) 10 MG tablet Take 1 tablet (10 mg total) by mouth daily. 90 tablet 1   No current facility-administered medications for this visit.    Allergies-reviewed and updated Allergies  Allergen Reactions   Motrin [Ibuprofen] Nausea Only    Social History   Social  History Narrative   Not on file   Objective  Objective:  BP 124/80   Pulse 88   Temp 98.4 F (36.9 C) (Temporal)   Ht 5\' 1"  (1.549 m)   Wt 155 lb (70.3 kg)   SpO2 99%   BMI 29.29 kg/m  Gen: NAD, resting comfortably HEENT: Mucous membranes are moist. Oropharynx normal Neck: no thyromegaly CV: RRR no murmurs rubs or gallops Lungs: CTAB no crackles, wheeze, rhonchi Abdomen: soft/nontender/nondistended/normal bowel sounds. No rebound or guarding.  Ext: no edema Skin: warm, dry Neuro: grossly normal, moves all extremities, PERRLA   Assessment and Plan   Health Maintenance counseling: 1. Anticipatory guidance: Patient counseled regarding regular dental exams q6 months, eye exams ,  avoiding smoking and second hand smoke, limiting alcohol to 1 beverage per day, no illicit drugs .   2. Risk factor reduction:  Advised patient of need for regular exercise and diet rich and fruits and vegetables to reduce risk of heart attack and stroke. Exercise- recommend 5-7 d/week. Diet-recommend healthy diet, low carb, low saturated fat, lean protein, 4 vege servings/day.  Wt Readings from Last 3 Encounters:  04/17/21 155 lb (70.3 kg)  11/01/20 156 lb 2 oz (70.8 kg)  09/25/20 156 lb 8 oz (71 kg)   3. Immunizations/screenings/ancillary studies Immunization History  Administered Date(s) Administered   PFIZER(Purple Top)SARS-COV-2 Vaccination 10/27/2019, 11/17/2019   Tdap 06/30/2018   Health Maintenance Due  Topic Date Due   Hepatitis C Screening  Never done   COLONOSCOPY (Pts 45-25yrs Insurance coverage will need to be confirmed)  Never done   PAP SMEAR-Modifier  01/29/2020   4. Cervical cancer screening- due, pt has appt with GYN this year. 5. Breast cancer screening-  mammogram 2022 6. Colon cancer screening - UTD 7. Skin cancer screening- advised regular sunscreen use. Denies worrisome, changing, or new skin lesions.  8. Birth control/STD check- N/A 9. Osteoporosis screening-  N/A 10. Smoking associated screening - non- smoker   Problem List Items Addressed This Visit       Nervous and Auditory   Neuralgia, postherpetic    Shingles breakout last year with rash on her face, not around eyes, pain had improved, but now feeling the tingling and burning pain on left side of face/jaw, sending refill of Gabapentin.      Relevant Medications   gabapentin (NEURONTIN) 300 MG capsule     Other   Hyperlipidemia - Primary   Relevant Medications   rosuvastatin (CRESTOR) 10 MG tablet   Other Relevant Orders   Lipid panel   Vitamin D deficiency   Relevant Orders   Vitamin D (25 hydroxy)   Annual physical exam    Mammogram recently, PAP due, pt has appt with GYN.  Relevant Orders   Comprehensive metabolic panel   TSH   CBC with Differential/Platelet     Recommended follow up: Return in about 6 months (around 10/15/2021) for refills. Future Appointments  Date Time Provider Department Center  10/16/2021  9:00 AM Dulce Sellar, NP [facility]     Lab/Order associations: fasting   ICD-10-CM   1. Mixed hyperlipidemia  E78.2 Lipid panel    rosuvastatin (CRESTOR) 10 MG tablet    2. Vitamin D deficiency  E55.9 Vitamin D (25 hydroxy)    3. Neuralgia, postherpetic  B02.29 gabapentin (NEURONTIN) 300 MG capsule    4. Annual physical exam  Z00.00 Comprehensive metabolic panel    TSH    CBC with Differential/Platelet      Meds ordered this encounter  Medications   gabapentin (NEURONTIN) 300 MG capsule    Sig: Take 1 capsule (300 mg total) by mouth 2 (two) times daily.    Dispense:  180 capsule    Refill:  1    Order Specific Question:   Supervising Provider    Answer:   ANDY, CAMILLE L [[id number]]   rosuvastatin (CRESTOR) 10 MG tablet    Sig: Take 1 tablet (10 mg total) by mouth daily.    Dispense:  90 tablet    Refill:  1    Order Specific Question:   Supervising Provider    Answer:   ANDY, CAMILLE L [[id number]]    Return precautions advised.   Dulce Sellar, NP

## 2021-04-17 NOTE — Patient Instructions (Signed)
It was very nice to see you today!  Your refills have been sent, please schedule a 6 mos follow up visit today.    PLEASE NOTE:  If you had any lab tests please let us know if you have not heard back within a few days. You may see your results on mychart before we have a chance to review them but we will give you a call once they are reviewed by [place]. If we ordered any referrals today, please let us know if you have not heard from their office within the next week.   Please try these tips to maintain a healthy lifestyle:  Eat most of your calories during the day when you are active. Eliminate processed foods including packaged sweets (pies, cakes, cookies), reduce intake of potatoes, white bread, white pasta, and white rice. Look for whole grain options, oat flour or almond flour.  Each meal should contain half fruits/vegetables, one quarter protein, and one quarter carbs (no bigger than a computer mouse).  Cut down on sweet beverages. This includes juice, soda, and sweet tea. Also watch fruit intake, though this is a healthier sweet option, it still contains natural sugar! Limit to 3 servings daily.  Drink at least 1 glass of water with each meal and aim for at least 8 glasses per day  Exercise at least 150 minutes every week.

## 2021-05-04 ENCOUNTER — Other Ambulatory Visit: Payer: Self-pay | Admitting: Family Medicine

## 2021-05-15 DIAGNOSIS — Z01419 Encounter for gynecological examination (general) (routine) without abnormal findings: Secondary | ICD-10-CM | POA: Diagnosis not present

## 2021-05-29 ENCOUNTER — Encounter: Payer: Self-pay | Admitting: Family

## 2021-05-29 ENCOUNTER — Ambulatory Visit: Payer: BC Managed Care – PPO | Admitting: Family

## 2021-05-29 ENCOUNTER — Other Ambulatory Visit: Payer: Self-pay

## 2021-05-29 VITALS — BP 145/98 | HR 80 | Temp 97.7°F | Ht 61.0 in | Wt 156.6 lb

## 2021-05-29 DIAGNOSIS — B0229 Other postherpetic nervous system involvement: Secondary | ICD-10-CM | POA: Diagnosis not present

## 2021-05-29 MED ORDER — TRAMADOL HCL 50 MG PO TABS: 50.0000 mg | ORAL_TABLET | Freq: Three times a day (TID) | ORAL | 0 refills | Status: DC | PRN

## 2021-05-29 NOTE — Progress Notes (Signed)
Subjective:     Patient ID: Madison Esparza, female    DOB: October 06, 1970, 50 y.o.   MRN: [id number]  Chief Complaint  Patient presents with   Jaw Pain    Left jaw pain. Pt complains of nerve pain since having shingles last October. She is currently taking Gabapentin 3x's daily but is still having pain.     HPI: Pain She reports recurrent facial, jaw pain, from left side of lower jaw at her ear around to chin, including her tongue. Reports having an infection that may have caused the pain. The pain started about a year ago and is gradually worsening. The pain does not radiate. The pain is described as burning and tingling, occurring intermittently. Symptoms are worse all day. Now reports her tongue is tingling, worse when she is talking (works as a Producer, television/film/video), eating or drinking. Aggravating factors: increased stress. Relieving factors: meds help some. She has tried NSAIDs with little relief. Pt reports relief with Gabapentin, but states she has had to increase dose to tid as well as take some old Tramadol she had which has helped, she would like a referral to see if anything else can be done.  Health Maintenance Due  Topic Date Due   Hepatitis C Screening  Never done   COLONOSCOPY (Pts 45-67yrs Insurance coverage will need to be confirmed)  Never done   COVID-19 Vaccine (3 - Booster for Pfizer series) 01/12/2020   PAP SMEAR-Modifier  01/29/2020    Past Medical History:  Diagnosis Date   Allergy    Anemia    IDA   Anxiety    Hyperlipidemia     Past Surgical History:  Procedure Laterality Date   COLONOSCOPY     5 years ago   DILATION AND CURETTAGE OF UTERUS      Outpatient Medications Prior to Visit  Medication Sig Dispense Refill   docusate sodium (COLACE) 100 MG capsule Take 100 mg by mouth daily.     fluticasone (FLONASE) 50 MCG/ACT nasal spray Use 1 spray(s) in each nostril once daily 16 g 0   gabapentin (NEURONTIN) 300 MG capsule Take 1 capsule (300 mg total) by mouth 2  (two) times daily. 180 capsule 1   pantoprazole (PROTONIX) 40 MG tablet TAKE 1 TABLET BY MOUTH TWICE DAILY BEFORE A MEAL . APPOINTMENT REQUIRED FOR FUTURE REFILLS 180 tablet 0   rosuvastatin (CRESTOR) 10 MG tablet Take 1 tablet (10 mg total) by mouth daily. 90 tablet 1   dicyclomine (BENTYL) 20 MG tablet Take 1 tablet (20 mg total) by mouth 4 (four) times daily -  before meals and at bedtime. (Patient not taking: Reported on 05/29/2021) 120 tablet 3   No facility-administered medications prior to visit.    Allergies  Allergen Reactions   Motrin [Ibuprofen] Nausea Only        Objective:    Physical Exam Vitals and nursing note reviewed.  Constitutional:      Appearance: Normal appearance.  Cardiovascular:     Rate and Rhythm: Normal rate and regular rhythm.  Pulmonary:     Effort: Pulmonary effort is normal.     Breath sounds: Normal breath sounds.  Musculoskeletal:        General: Normal range of motion.  Lymphadenopathy:     Head:     Right side of head: No preauricular or posterior auricular adenopathy.     Left side of head: No preauricular or posterior auricular adenopathy.     Cervical: No cervical  adenopathy.     Comments: no facial edema or tenderness w/palpation noted  Skin:    General: Skin is warm and dry.  Neurological:     Mental Status: She is alert.     Cranial Nerves: Cranial nerves 2-12 are intact.     Sensory: Sensation is intact.  Psychiatric:        Mood and Affect: Mood normal.        Behavior: Behavior normal.    BP (!) 145/98    Pulse 80    Temp 97.7 F (36.5 C) (Temporal)    Ht 5\' 1"  (1.549 m)    Wt 156 lb 9.6 oz (71 kg)    SpO2 99%    BMI 29.59 kg/m  Wt Readings from Last 3 Encounters:  05/29/21 156 lb 9.6 oz (71 kg)  04/17/21 155 lb (70.3 kg)  11/01/20 156 lb 2 oz (70.8 kg)       Assessment & Plan:   Problem List Items Addressed This Visit       Nervous and Auditory   Neuralgia, postherpetic - Primary    pt reports worsening of  pain, now involving tongue, interfering with her work,  has not received shingrix vaccine yet. sending referral to Neuro.      Relevant Medications   traMADol (ULTRAM) 50 MG tablet   Other Relevant Orders   Ambulatory referral to Neurology    Meds ordered this encounter  Medications   traMADol (ULTRAM) 50 MG tablet    Sig: Take 1 tablet (50 mg total) by mouth every 8 (eight) hours as needed.    Dispense:  30 tablet    Refill:  0    Order Specific Question:   Supervising Provider    Answer:   ANDY, CAMILLE L [[id number]]

## 2021-05-29 NOTE — Assessment & Plan Note (Signed)
pt reports worsening of pain, now involving tongue, interfering with her work,  has not received shingrix vaccine yet. sending referral to Neuro.

## 2021-05-31 ENCOUNTER — Encounter: Payer: Self-pay | Admitting: Neurology

## 2021-07-06 ENCOUNTER — Other Ambulatory Visit: Payer: Self-pay | Admitting: Gastroenterology

## 2021-08-12 ENCOUNTER — Ambulatory Visit: Payer: BC Managed Care – PPO | Admitting: Neurology

## 2021-08-12 ENCOUNTER — Other Ambulatory Visit: Payer: Self-pay | Admitting: Family

## 2021-08-12 ENCOUNTER — Other Ambulatory Visit: Payer: Self-pay | Admitting: Gastroenterology

## 2021-08-12 ENCOUNTER — Encounter: Payer: Self-pay | Admitting: Neurology

## 2021-08-12 ENCOUNTER — Other Ambulatory Visit (INDEPENDENT_AMBULATORY_CARE_PROVIDER_SITE_OTHER): Payer: BC Managed Care – PPO

## 2021-08-12 ENCOUNTER — Other Ambulatory Visit: Payer: Self-pay

## 2021-08-12 VITALS — BP 150/93 | HR 77 | Ht 61.0 in | Wt 155.0 lb

## 2021-08-12 DIAGNOSIS — Z789 Other specified health status: Secondary | ICD-10-CM | POA: Diagnosis not present

## 2021-08-12 DIAGNOSIS — G500 Trigeminal neuralgia: Secondary | ICD-10-CM

## 2021-08-12 LAB — B12 AND FOLATE PANEL
Folate: 5.8 ng/mL — ABNORMAL LOW (ref >5.9)
Vitamin B-12: 231 pg/mL (ref 211–911)

## 2021-08-12 NOTE — Telephone Encounter (Signed)
Patient needs office visit.  

## 2021-08-12 NOTE — Progress Notes (Signed)
?[facility] ?Neurology Division ?Clinic Note - Initial Visit ? ? ?Date: 08/12/21 ? ?Mahlon Gammon ?MRN: [id number] ?DOB: 01-23-1971 ? ? ?Dear Dulce Sellar, NP: ? ?Thank you for your kind referral of Madison Esparza for consultation of left facial pain. Although her history is well known to you, please allow [place] to reiterate it for the purpose of our medical record. The patient was accompanied to the clinic by self. ? ? ?History of Present Illness: ?Madison Esparza is a 51 y.o. right-handed female with hyperlipidemia and anxiety presenting for evaluation of left facial pain.  ? ?She had right skin rash involving the inside of her mouth, upper lips, and ear in October of 2021.  She was diagnosed with shingles.  At the same time, she recalls having left sided jar, teeth, and tongue pain.  Pain is described as needle-like and shooting, lasting a few seconds. Eating, drinking, brushing teeth, cold air and talking triggers her pain.  Pain lasted from October - January 2021 and then subsided.  She was doing well for about 6 months and then left sided pain returned.  She was taking gabapentin 300mg  three times daily, but able to reduce to twice daily.  She currently does not have any pain.  ? ?She drinks at least 2 glasses of wine nightly.  ?She works as a . ? ?Out-side paper records, electronic medical record, and images have been reviewed where available and summarized as:  ?Lab Results  ?Component Value Date  ? TSH 1.42 04/17/2021  ? ?Past Medical History:  ?Diagnosis Date  ? Allergy   ? Anemia   ? IDA  ? Anxiety   ? Hyperlipidemia   ? ? ?Past Surgical History:  ?Procedure Laterality Date  ? COLONOSCOPY    ? 5 years ago  ? DILATION AND CURETTAGE OF UTERUS    ? ? ? ?Medications:  ?Outpatient Encounter Medications as of 08/12/2021  ?Medication Sig  ? docusate sodium (COLACE) 100 MG capsule Take 100 mg by mouth daily.  ? fluticasone (FLONASE) 50 MCG/ACT nasal spray Use 1 spray(s) in each nostril once daily  ?  gabapentin (NEURONTIN) 300 MG capsule Take 1 capsule (300 mg total) by mouth 2 (two) times daily.  ? pantoprazole (PROTONIX) 40 MG tablet TAKE 1 TABLET BY MOUTH TWICE DAILY BEFORE A MEAL . APPOINTMENT REQUIRED FOR FUTURE REFILLS  ? rosuvastatin (CRESTOR) 10 MG tablet Take 1 tablet (10 mg total) by mouth daily.  ? traMADol (ULTRAM) 50 MG tablet Take 1 tablet (50 mg total) by mouth every 8 (eight) hours as needed.  ? [DISCONTINUED] dicyclomine (BENTYL) 20 MG tablet Take 1 tablet (20 mg total) by mouth 4 (four) times daily -  before meals and at bedtime. (Patient not taking: Reported on 08/12/2021)  ? ?No facility-administered encounter medications on file as of 08/12/2021.  ? ? ?Allergies:  ?Allergies  ?Allergen Reactions  ? Motrin [Ibuprofen] Nausea Only  ? ? ?Family History: ?Family History  ?Problem Relation Age of Onset  ? Arthritis Mother   ? Hyperlipidemia Mother   ? Miscarriages / 10/12/2021 Mother   ? Arthritis Sister   ? Heart attack Sister   ? Heart disease Sister   ? Heart disease Brother   ? Arthritis Maternal Grandmother   ? Hearing loss Maternal Grandmother   ? Hypertension Maternal Grandmother   ? Hearing loss Maternal Grandfather   ? Breast cancer Cousin   ? Colon cancer Neg Hx   ? Esophageal cancer Neg Hx   ?  Pancreatic cancer Neg Hx   ? Stomach cancer Neg Hx   ? Liver disease Neg Hx   ? ? ?Social History: ?Social History  ? ?Tobacco Use  ? Smoking status: Former  ?  Types: Cigarettes  ? Smokeless tobacco: Never  ?Vaping Use  ? Vaping Use: Never used  ?Substance Use Topics  ? Alcohol use: Yes  ?  Alcohol/week: 7.0 standard drinks  ?  Types: 7 Glasses of wine per week  ?  Comment: Wine every day  ? Drug use: Never  ? ?Social History  ? ?Social History Narrative  ? Right Handed   ? Lives in a two story home   ? ? ?Vital Signs:  ?BP (!) 150/93   Pulse 77   Ht 5\' 1"  (1.549 m)   Wt 155 lb (70.3 kg)   SpO2 98%   BMI 29.29 kg/m?  ?  ?Neurological Exam: ?MENTAL STATUS including orientation to time, place,  person, recent and remote memory, attention span and concentration, language, and fund of knowledge is normal.  Speech is not dysarthric. ? ?CRANIAL NERVES: ?II:  No visual field defects.   ?III-IV-VI: Pupils equal round and reactive to light.  Normal conjugate, extra-ocular eye movements in all directions of gaze.  No nystagmus.  No ptosis.   ?V:  Normal facial sensation.    ?VII:  Normal facial symmetry and movements.   ?VIII:  Normal hearing and vestibular function.   ?IX-X:  Normal palatal movement.   ?XI:  Normal shoulder shrug and head rotation.   ?XII:  Normal tongue strength and range of motion, no deviation or fasciculation. ? ?MOTOR:  No atrophy, fasciculations or abnormal movements.  No pronator drift.  ? ?Upper Extremity:  Right  Left  ?Deltoid  5/5   5/5   ?Biceps  5/5   5/5   ?Triceps  5/5   5/5   ?Infraspinatus 5/5  5/5  ?Medial pectoralis 5/5  5/5  ?Wrist extensors  5/5   5/5   ?Wrist flexors  5/5   5/5   ?Finger extensors  5/5   5/5   ?Finger flexors  5/5   5/5   ?Dorsal interossei  5/5   5/5   ?Abductor pollicis  5/5   5/5   ?Tone (Ashworth scale)  0  0  ? ?Lower Extremity:  Right  Left  ?Hip flexors  5/5   5/5   ?Hip extensors  5/5   5/5   ?Adductor 5/5  5/5  ?Abductor 5/5  5/5  ?Knee flexors  5/5   5/5   ?Knee extensors  5/5   5/5   ?Dorsiflexors  5/5   5/5   ?Plantarflexors  5/5   5/5   ?Toe extensors  5/5   5/5   ?Toe flexors  5/5   5/5   ?Tone (Ashworth scale)  0  0  ? ?MSRs:  ?Right        Left                  ?brachioradialis 2+  2+  ?biceps 2+  2+  ?triceps 2+  2+  ?patellar 2+  2+  ?ankle jerk 2+  2+  ?Hoffman no  no  ?plantar response down  down  ? ?SENSORY:  Normal and symmetric perception of light touch, pinprick, vibration, and proprioception.  Romberg's sign absent.  ? ?COORDINATION/GAIT: Normal finger-to- nose-finger.  Intact rapid alternating movements bilaterally.   Gait narrow based and stable. Tandem and stressed  gait intact.  ? ? ?IMPRESSION: ?Left trigeminal neuralgia ?- MRI  trigeminal nerve protocol wwo contrast ?- OK to adjust gabapentin as needed for pain, currently 300mg  BID.  When pain is better controlled, she can try to reduce to 300mg  at bedtime ?- If pain gets worse, titrate gabapentin or consider switching to carbamazepine ? ?2.  Alcohol use (drinking 2-3 glasses of wine nightly) ? - Check vitamin B12, folate, and vitamin B1 ? - Encouraged to try to cut back on alcohol consumption ? ?Return to clinic in 8 months ? ? ?Thank you for allowing me to participate in patient's care.  If I can answer any additional questions, I would be pleased to do so.   ? ?Sincerely, ? ? ? ?Antasia Haider K. Allena Katz, DO ? ?

## 2021-08-12 NOTE — Patient Instructions (Signed)
Check labs ?MRI brain  ?You can adjust gabapentin as needed for pain.  OK to continue 300mg  twice daily and reduce to once daily, if pain seems better.  ? ?Return to clinic in 6-8 months ?

## 2021-08-15 LAB — VITAMIN B1: Vitamin B1 (Thiamine): 13 nmol/L (ref 8–30)

## 2021-08-23 ENCOUNTER — Encounter (HOSPITAL_BASED_OUTPATIENT_CLINIC_OR_DEPARTMENT_OTHER): Payer: Self-pay

## 2021-08-23 ENCOUNTER — Emergency Department (HOSPITAL_BASED_OUTPATIENT_CLINIC_OR_DEPARTMENT_OTHER)
Admission: EM | Admit: 2021-08-23 | Discharge: 2021-08-23 | Disposition: A | Discharge status: Home / Self Care | Payer: BC Managed Care – PPO | Attending: Emergency Medicine | Admitting: Emergency Medicine

## 2021-08-23 ENCOUNTER — Other Ambulatory Visit (HOSPITAL_BASED_OUTPATIENT_CLINIC_OR_DEPARTMENT_OTHER): Payer: Self-pay

## 2021-08-23 ENCOUNTER — Other Ambulatory Visit: Payer: Self-pay

## 2021-08-23 DIAGNOSIS — M545 Low back pain, unspecified: Secondary | ICD-10-CM | POA: Insufficient documentation

## 2021-08-23 MED ORDER — KETOROLAC TROMETHAMINE 60 MG/2ML IM SOLN
60.0000 mg | Freq: Once | INTRAMUSCULAR | Status: AC
  Administered 2021-08-23: 60 mg via INTRAMUSCULAR
  Filled 2021-08-23: qty 2

## 2021-08-23 MED ORDER — DICLOFENAC SODIUM 1 % EX GEL
4.0000 g | Freq: Four times a day (QID) | CUTANEOUS | 1 refills | Status: DC
  Filled 2021-08-23: qty 100, 17d supply, fill #0

## 2021-08-23 MED ORDER — CYCLOBENZAPRINE HCL 10 MG PO TABS
10.0000 mg | ORAL_TABLET | Freq: Two times a day (BID) | ORAL | 0 refills | Status: DC | PRN
  Filled 2021-08-23: qty 20, 10d supply, fill #0

## 2021-08-23 MED ORDER — CYCLOBENZAPRINE HCL 10 MG PO TABS
10.0000 mg | ORAL_TABLET | Freq: Once | ORAL | Status: AC
  Administered 2021-08-23: 10 mg via ORAL
  Filled 2021-08-23: qty 1

## 2021-08-23 NOTE — Discharge Instructions (Signed)
Make sure you are taking Tylenol 2 extra strength every 6 hours, using the Voltaren gel and the muscle relaxer as needed.  Heating pad and continued movement until improved.  Avoid prolonged standing any lifting bending or twisting ?

## 2021-08-23 NOTE — ED Provider Notes (Signed)
?[facility] ?Provider Note ? ? ?CSN: [id number] ?Arrival date & time: 08/23/21  0746 ? ?  ? ?History ? ?Chief Complaint  ?Patient presents with  ? Back Pain  ? ? ?Madison Esparza is a 51 y.o. female. ? ?Patient is a 51 year old female with a history of trigeminal neuralgia on 1 gabapentin per day, anemia who is presenting today with complaints of lower back pain.  Patient reports all week this week she had been painting and she had noticed a mild catch in her back but last night she was bending over to get something out of a cabinet when she dropped to her knees due to significant discomfort in her lumbar back mostly on the left side.  She took a tramadol to try to sleep at night but this morning her back was so uncomfortable it took her 30 minutes to get out of bed.  She describes it as a tight pressure sensation in the left side of her back that is worse when she lifts her left leg.  She denies any weakness or numbness.  No radiation of pain down her leg.  No bowel or bladder incontinence or urinary retention.  She does report having a history of back pain in her 43s but has never had any surgery.  She has not taken anything this morning for the discomfort. ? ?The history is provided by the patient.  ?Back Pain ?Location:  Lumbar spine ? ?  ? ?Home Medications ?Prior to Admission medications   ?Medication Sig Start Date End Date Taking? Authorizing Provider  ?cyclobenzaprine (FLEXERIL) 10 MG tablet Take 1 tablet (10 mg total) by mouth 2 (two) times daily as needed for muscle spasms. 08/23/21  Yes Blanchie Dessert, MD  ?diclofenac Sodium (VOLTAREN) 1 % GEL Apply 4 g topically 4 (four) times daily. 08/23/21  Yes Blanchie Dessert, MD  ?dicyclomine (BENTYL) 20 MG tablet Take 1 tablet (20 mg total) by mouth 4 (four) times daily -  before meals and at bedtime. Pt needs office visit 08/12/21   Thornton Park, MD  ?docusate sodium (COLACE) 100 MG capsule Take 100 mg by mouth daily.    [provider]  ?fluticasone Asencion Islam) 50 MCG/ACT nasal spray Use 1 spray(s) in each nostril once daily 10/12/20   Orma Flaming, MD  ?gabapentin (NEURONTIN) 300 MG capsule Take 1 capsule (300 mg total) by mouth 2 (two) times daily. 04/17/21   Jeanie Sewer, NP  ?pantoprazole (PROTONIX) 40 MG tablet TAKE 1 TABLET BY MOUTH TWICE DAILY BEFORE MEAL(S) . APPOINTMENT REQUIRED FOR FUTURE REFILLS 08/12/21   Jeanie Sewer, NP  ?rosuvastatin (CRESTOR) 10 MG tablet Take 1 tablet (10 mg total) by mouth daily. 04/17/21   Jeanie Sewer, NP  ?traMADol (ULTRAM) 50 MG tablet Take 1 tablet (50 mg total) by mouth every 8 (eight) hours as needed. 05/29/21   Jeanie Sewer, NP  ?   ? ?Allergies    ?Motrin [ibuprofen]   ? ?Review of Systems   ?Review of Systems  ?Musculoskeletal:  Positive for back pain.  ? ?Physical Exam ?Updated Vital Signs ?BP (!) 143/98 (BP Location: Right Arm)   Pulse 77   Temp 98.4 ?F (36.9 ?C) (Oral)   Resp 14   Ht 5\' 1"  (1.549 m)   Wt 68.9 kg   SpO2 99%   BMI 28.72 kg/m?  ?Physical Exam ?Vitals and nursing note reviewed.  ?Constitutional:   ?   General: She is not in acute distress. ?   Appearance:  She is well-developed.  ?   Comments: Appears uncomfortable  ?HENT:  ?   Head: Normocephalic and atraumatic.  ?Eyes:  ?   Pupils: Pupils are equal, round, and reactive to light.  ?Cardiovascular:  ?   Rate and Rhythm: Normal rate and regular rhythm.  ?   Heart sounds: Normal heart sounds. No murmur heard. ?  No friction rub.  ?Pulmonary:  ?   Effort: Pulmonary effort is normal.  ?   Breath sounds: Normal breath sounds. No wheezing or rales.  ?Abdominal:  ?   General: Bowel sounds are normal. There is no distension.  ?   Palpations: Abdomen is soft.  ?   Tenderness: There is no abdominal tenderness. There is no guarding or rebound.  ?Musculoskeletal:     ?   General: Tenderness present. Normal range of motion.  ?     Back: ? ?   Comments: No edema  ?Skin: ?   General: Skin is warm and dry.  ?    Findings: No rash.  ?Neurological:  ?   Mental Status: She is alert and oriented to person, place, and time. Mental status is at baseline.  ?   Cranial Nerves: No cranial nerve deficit.  ?   Sensory: No sensory deficit.  ?   Motor: No weakness.  ?Psychiatric:     ?   Behavior: Behavior normal.  ? ? ?ED Results / Procedures / Treatments   ?Labs ?(all labs ordered are listed, but only abnormal results are displayed) ?Labs Reviewed - No data to display ? ?EKG ?None ? ?Radiology ?No results found. ? ?Procedures ?Procedures  ? ? ?Medications Ordered in ED ?Medications  ?ketorolac (TORADOL) injection 60 mg (60 mg Intramuscular Given 08/23/21 0806)  ?cyclobenzaprine (FLEXERIL) tablet 10 mg (10 mg Oral Given 08/23/21 0806)  ? ? ?ED Course/ Medical Decision Making/ A&P ?  ?                        ?Medical Decision Making ?Risk ?Prescription drug management. ? ? ?Pt with gradual onset of back pain suggestive of msk vs disc protrusion.  No neurovascular compromise and no incontinence.  Pt has no infectious sx, hx of CA  or other red flags concerning for pathologic back pain.  Pt is able to ambulate but is painful.  Normal strength and sensation on exam.  Denies trauma.  Low suspicion for cauda equina.  External medical records from patient's recent neurology visit were reviewed. ?Will give pt pain control with Toradol and Flexeril.  Patient reports when she takes Motrin it makes her nauseated but she has no other known allergy.  She has had meloxicam before with no problem.  Discussed with patient and her husband normal course of events including she will most likely have pain for days and it may be worse tomorrow.  Discussed muscle relaxers, Voltaren gel, heating pad.  In the future she may benefit from chiropractor if her pain is not improving versus physical therapy.  To return for developement of above sx. ? ? ? ? ? ? ? ? ?Final Clinical Impression(s) / ED Diagnoses ?Final diagnoses:  ?Acute left-sided low back pain without  sciatica  ? ? ?Rx / DC Orders ?ED Discharge Orders   ? ?      Ordered  ?  cyclobenzaprine (FLEXERIL) 10 MG tablet  2 times daily PRN       ? 08/23/21 0904  ?  diclofenac Sodium (VOLTAREN) 1 %  GEL  4 times daily       ? 08/23/21 0904  ? ?  ?  ? ?  ? ? ?  ?Blanchie Dessert, MD ?08/23/21 [phone] ? ?

## 2021-08-23 NOTE — ED Triage Notes (Signed)
Patient states she was painting earlier this week. After that she felt soreness in lower left back. Last night she felt like it "caught" and had severe pain. Took tramadol last night  ?

## 2021-09-03 ENCOUNTER — Other Ambulatory Visit: Payer: Self-pay

## 2021-09-03 ENCOUNTER — Ambulatory Visit
Admission: RE | Admit: 2021-09-03 | Discharge: 2021-09-03 | Disposition: A | Discharge status: Home / Self Care | Payer: BC Managed Care – PPO | Source: Ambulatory Visit | Attending: Neurology | Admitting: Neurology

## 2021-09-03 DIAGNOSIS — G500 Trigeminal neuralgia: Secondary | ICD-10-CM

## 2021-09-03 MED ORDER — GADOBENATE DIMEGLUMINE 529 MG/ML IV SOLN
14.0000 mL | Freq: Once | INTRAVENOUS | Status: AC | PRN
  Administered 2021-09-03: 14 mL via INTRAVENOUS

## 2021-10-16 ENCOUNTER — Ambulatory Visit: Payer: BC Managed Care – PPO | Admitting: Family

## 2021-10-21 ENCOUNTER — Other Ambulatory Visit: Payer: Self-pay | Admitting: Family

## 2021-10-21 DIAGNOSIS — E782 Mixed hyperlipidemia: Secondary | ICD-10-CM

## 2021-11-02 IMAGING — US US BREAST*L* LIMITED INC AXILLA
1 series · 4 of 4 positions shown · non-contrast
Comparison: Previous exam(s).

CLINICAL DATA: 49-year-old female presenting for intermittent left
breast/axillary tenderness for 1 month.

EXAM:
DIGITAL DIAGNOSTIC BILATERAL MAMMOGRAM WITH TOMOSYNTHESIS AND CAD;
ULTRASOUND LEFT BREAST LIMITED
TECHNIQUE: Bilateral digital diagnostic mammography and breast tomosynthesis
was performed. The images were evaluated with computer-aided
detection.; Targeted ultrasound examination of the left breast was
performed

[Series 1: us breast*left* limited inc axilla · 0.06mm/px · 4 of 4 slices shown]
[im 1/4]
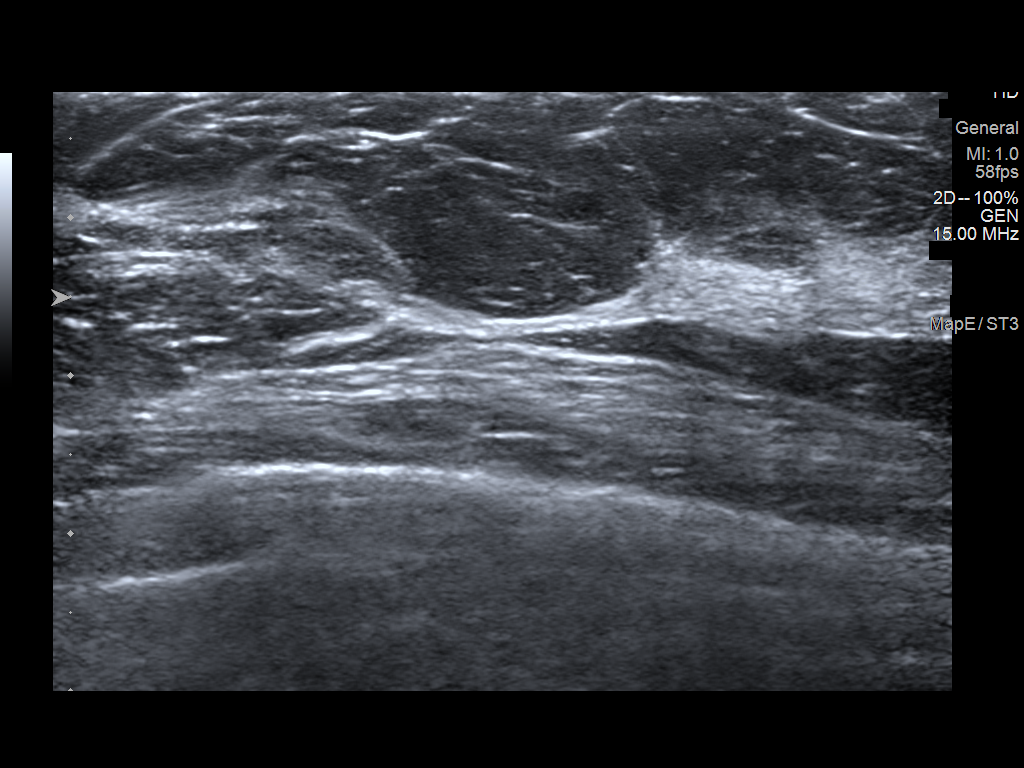
[im 2/4]
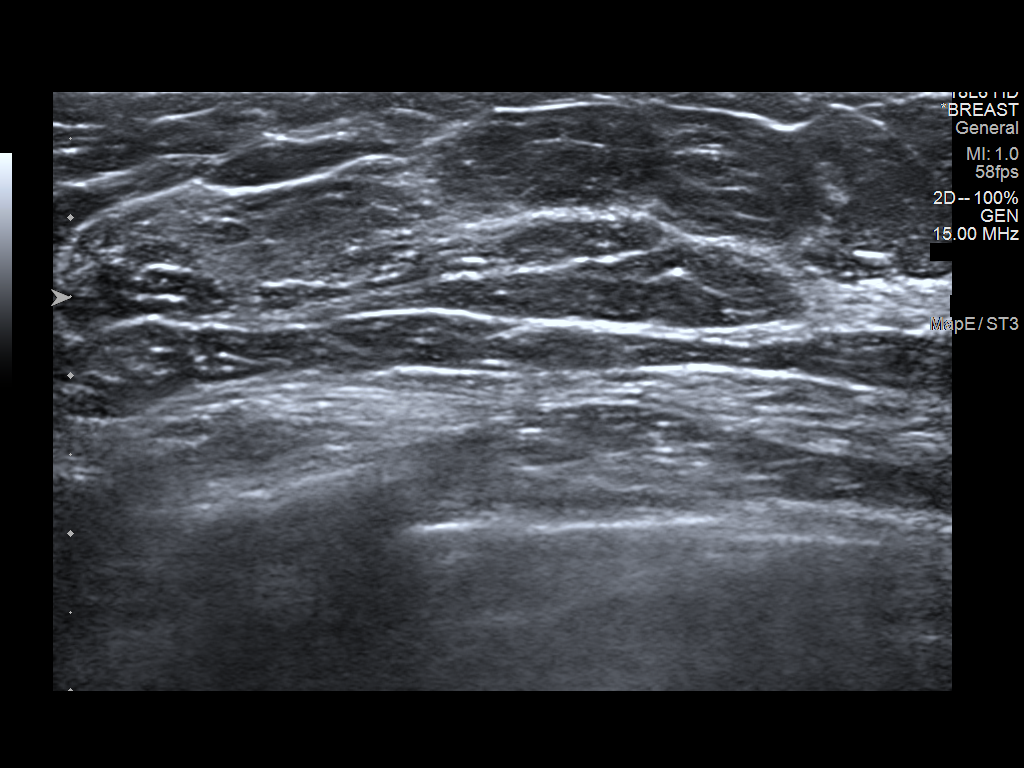
[im 3/4]
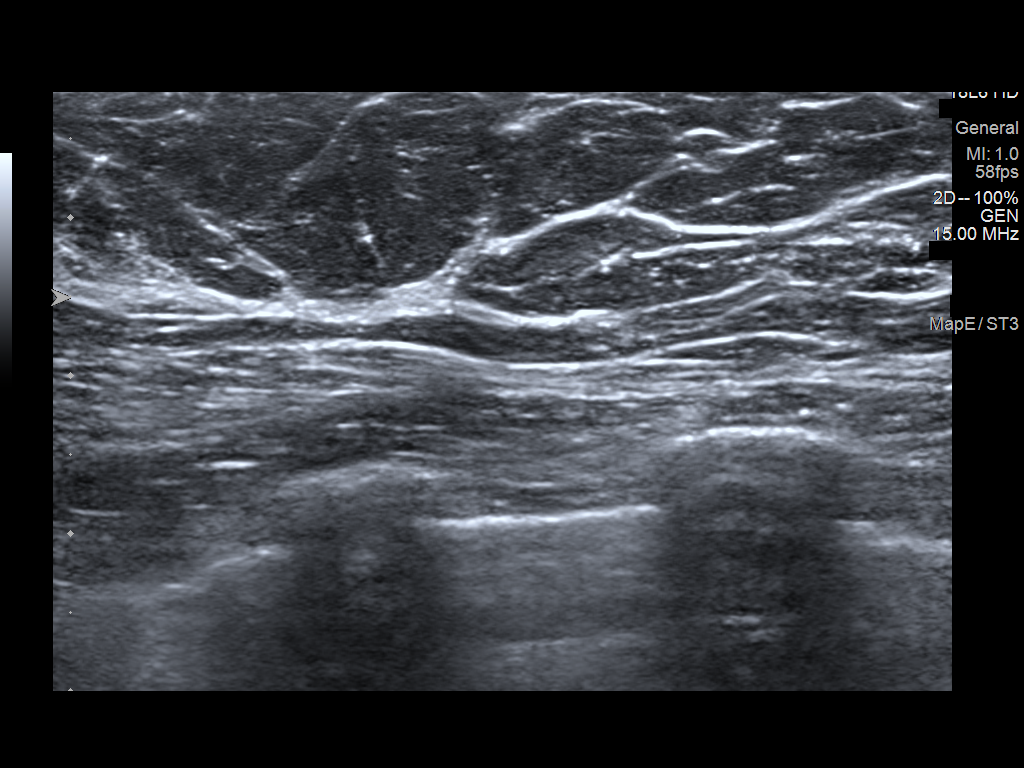
[im 4/4]
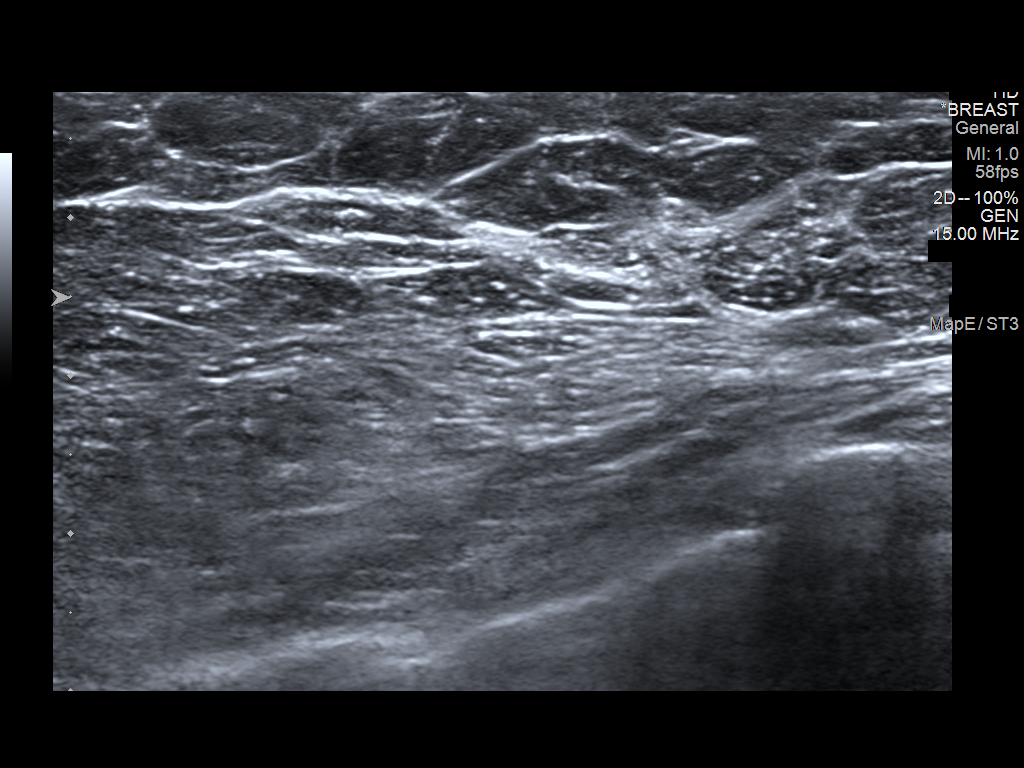

[4 of 4 positions shown; findings below may reference images not displayed]

ACR Breast Density Category c: The breast tissue is heterogeneously
dense, which may obscure small masses.
FINDINGS: No suspicious calcifications, masses or areas of distortion are seen
in the bilateral breasts.

Ultrasound of the axillary tail/low axilla demonstrates normal
fibroglandular tissue. No suspicious masses or areas of shadowing
are identified.
IMPRESSION: 1. There are no suspicious mammographic or targeted sonographic
abnormalities in the upper-outer left breast/low axilla to explain
the patient's pain.

2.  No mammographic evidence of malignancy in the bilateral breasts.

RECOMMENDATION:
1. Clinical follow-up recommended for the tender area of concern in
the upper-outer left breast/low axilla. Any further workup should be
based on clinical grounds.

2.  Screening mammogram in one year.(Code:[id number]-[id number])

I have discussed the findings and recommendations with the patient.
If applicable, a reminder letter will be sent to the patient
regarding the next appointment.

BI-RADS CATEGORY  1: Negative.

## 2021-11-06 ENCOUNTER — Other Ambulatory Visit: Payer: Self-pay | Admitting: Obstetrics and Gynecology

## 2021-11-06 DIAGNOSIS — Z1231 Encounter for screening mammogram for malignant neoplasm of breast: Secondary | ICD-10-CM

## 2021-11-17 ENCOUNTER — Other Ambulatory Visit: Payer: Self-pay | Admitting: Family

## 2021-11-17 DIAGNOSIS — B0229 Other postherpetic nervous system involvement: Secondary | ICD-10-CM

## 2021-11-19 ENCOUNTER — Ambulatory Visit
Admission: RE | Admit: 2021-11-19 | Discharge: 2021-11-19 | Disposition: A | Discharge status: Home / Self Care | Payer: BC Managed Care – PPO | Source: Ambulatory Visit | Attending: Obstetrics and Gynecology | Admitting: Obstetrics and Gynecology

## 2021-11-19 DIAGNOSIS — Z1231 Encounter for screening mammogram for malignant neoplasm of breast: Secondary | ICD-10-CM | POA: Diagnosis not present

## 2021-11-20 ENCOUNTER — Other Ambulatory Visit: Payer: Self-pay | Admitting: Gastroenterology

## 2021-11-20 ENCOUNTER — Other Ambulatory Visit: Payer: Self-pay | Admitting: Family

## 2021-11-20 DIAGNOSIS — B0229 Other postherpetic nervous system involvement: Secondary | ICD-10-CM

## 2021-11-26 ENCOUNTER — Encounter: Payer: Self-pay | Admitting: Neurology

## 2021-11-26 ENCOUNTER — Other Ambulatory Visit: Payer: Self-pay

## 2021-11-26 DIAGNOSIS — B0229 Other postherpetic nervous system involvement: Secondary | ICD-10-CM

## 2021-11-26 MED ORDER — GABAPENTIN 300 MG PO CAPS: 600.0000 mg | ORAL_CAPSULE | Freq: Three times a day (TID) | ORAL | 3 refills | Status: DC

## 2021-12-08 ENCOUNTER — Other Ambulatory Visit: Payer: Self-pay | Admitting: Family

## 2021-12-13 ENCOUNTER — Other Ambulatory Visit: Payer: Self-pay | Admitting: Family

## 2021-12-13 DIAGNOSIS — B0229 Other postherpetic nervous system involvement: Secondary | ICD-10-CM

## 2021-12-15 ENCOUNTER — Other Ambulatory Visit: Payer: Self-pay | Admitting: Family

## 2021-12-15 DIAGNOSIS — B0229 Other postherpetic nervous system involvement: Secondary | ICD-10-CM

## 2021-12-17 ENCOUNTER — Other Ambulatory Visit: Payer: Self-pay | Admitting: Family

## 2021-12-17 DIAGNOSIS — B0229 Other postherpetic nervous system involvement: Secondary | ICD-10-CM

## 2021-12-19 NOTE — Telephone Encounter (Signed)
We need to do at least a virtual visit for this since controlled substance

## 2021-12-20 ENCOUNTER — Telehealth (INDEPENDENT_AMBULATORY_CARE_PROVIDER_SITE_OTHER): Payer: BC Managed Care – PPO | Admitting: Family

## 2021-12-20 ENCOUNTER — Encounter: Payer: Self-pay | Admitting: Family

## 2021-12-20 VITALS — Ht 60.0 in | Wt 150.0 lb

## 2021-12-20 DIAGNOSIS — G500 Trigeminal neuralgia: Secondary | ICD-10-CM

## 2021-12-20 MED ORDER — TRAMADOL HCL 50 MG PO TABS: 50.0000 mg | ORAL_TABLET | Freq: Three times a day (TID) | ORAL | 1 refills | Status: DC | PRN

## 2021-12-20 MED ORDER — CYCLOBENZAPRINE HCL 5 MG PO TABS: 5.0000 mg | ORAL_TABLET | Freq: Three times a day (TID) | ORAL | 1 refills | Status: DC | PRN

## 2021-12-20 NOTE — Progress Notes (Signed)
MyChart Video Visit    Virtual Visit via Video Note   This visit type was conducted due to national recommendations for restrictions regarding the COVID-19 Pandemic (e.g. social distancing) in an effort to limit this patient's exposure and mitigate transmission in our community. This patient is at least at moderate risk for complications without adequate follow up. This format is felt to be most appropriate for this patient at this time. Physical exam was limited by quality of the video and audio technology used for the visit. CMA was able to get the patient set up on a video visit.  Patient location: Home. Patient and provider in visit Provider location: Office  I discussed the limitations of evaluation and management by telemedicine and the availability of in person appointments. The patient expressed understanding and agreed to proceed.  Visit Date: 12/20/2021  Today's healthcare provider: Dulce Sellar, NP     Subjective:    Patient ID: Madison Esparza, female    DOB: 06/26/1970, 51 y.o.   MRN: [id number]  Chief Complaint  Patient presents with   Follow-up    Neuralgia, Medication refill of Tramadol    HPI Pain She reports recurrent facial, jaw pain, from left side of lower jaw at her ear around to chin, including her tongue. Reports having an infection that may have caused the pain. The pain started about a year ago and is gradually worsening. The pain does not radiate. The pain is described as burning and tingling, occurring intermittently. Symptoms are worse all day. Now reports her tongue is tingling, worse when she is talking (works as a Producer, television/film/video), eating or drinking. Aggravating factors: increased stress. Relieving factors: meds help some. She has tried NSAIDs with little relief. Pt reports relief with Gabapentin & Tramadol prn. Has seen [facility] and told a blood vessel was pushing on her nerve, they are wanting to treat conservatively first, then talk about possible  surgical options. She has a f/u in 5 months.  Assessment & Plan:   Problem List Items Addressed This Visit       Nervous and Auditory   Left-sided trigeminal neuralgia - Primary    Chronic - seen by Neuro  Neuro rec conservative tx if pain not resolving, will discuss other options f/u appt in 5 mos pt gets relief with Gabapentin and Tramadol prn - sending refill today f/u prn      Relevant Medications   traMADol (ULTRAM) 50 MG tablet   cyclobenzaprine (FLEXERIL) 5 MG tablet    Past Medical History:  Diagnosis Date   Allergy    Anemia    IDA   Annual physical exam 04/17/2021   Anxiety    Hyperlipidemia    Intermenstrual bleeding 07/25/2020    Past Surgical History:  Procedure Laterality Date   COLONOSCOPY     5 years ago   DILATION AND CURETTAGE OF UTERUS      Outpatient Medications Prior to Visit  Medication Sig Dispense Refill   dicyclomine (BENTYL) 20 MG tablet Take 1 tablet (20 mg total) by mouth 4 (four) times daily -  before meals and at bedtime. Pt needs office visit 120 tablet 0   docusate sodium (COLACE) 100 MG capsule Take 100 mg by mouth daily.     fluticasone (FLONASE) 50 MCG/ACT nasal spray Use 1 spray(s) in each nostril once daily 16 g 0   gabapentin (NEURONTIN) 300 MG capsule Take 2 capsules (600 mg total) by mouth 3 (three) times daily. 180 capsule 3  pantoprazole (PROTONIX) 40 MG tablet TAKE 1 TABLET BY MOUTH TWICE DAILY BEFORE MEAL(S) . APPOINTMENT REQUIRED FOR FUTURE REFILLS 180 tablet 0   rosuvastatin (CRESTOR) 10 MG tablet Take 1 tablet by mouth once daily 90 tablet 0   traMADol (ULTRAM) 50 MG tablet Take 1 tablet (50 mg total) by mouth every 8 (eight) hours as needed. 30 tablet 0   cyclobenzaprine (FLEXERIL) 10 MG tablet Take 1 tablet (10 mg total) by mouth 2 (two) times daily as needed for muscle spasms. (Patient not taking: Reported on 12/20/2021) 20 tablet 0   diclofenac Sodium (VOLTAREN) 1 % GEL Apply 4 grams topically 4 (four) times daily.  (Patient not taking: Reported on 12/20/2021) 100 g 1   No facility-administered medications prior to visit.    Allergies  Allergen Reactions   Motrin [Ibuprofen] Nausea Only       Objective:     Physical Exam Vitals and nursing note reviewed.  Constitutional:      General: She is not in acute distress.    Appearance: Normal appearance.  HENT:     Head: Normocephalic.  Pulmonary:     Effort: No respiratory distress.  Musculoskeletal:     Cervical back: Normal range of motion.  Skin:    General: Skin is dry.     Coloration: Skin is not pale.  Neurological:     Mental Status: She is alert and oriented to person, place, and time.  Psychiatric:        Mood and Affect: Mood normal.   Ht 5' (1.524 m)   Wt 150 lb (68 kg)   LMP  (LMP Unknown)   BMI 29.29 kg/m   Wt Readings from Last 3 Encounters:  12/20/21 150 lb (68 kg)  08/23/21 152 lb (68.9 kg)  08/12/21 155 lb (70.3 kg)       I discussed the assessment and treatment plan with the patient. The patient was provided an opportunity to ask questions and all were answered. The patient agreed with the plan and demonstrated an understanding of the instructions.   The patient was advised to call back or seek an in-person evaluation if the symptoms worsen or if the condition fails to improve as anticipated.  I provided 22 minutes of face-to-face time during this encounter.  Dulce Sellar, NP [facility] [phone] (phone) [phone] (fax)  [facility]

## 2021-12-20 NOTE — Assessment & Plan Note (Addendum)
Chronic - seen by Neuro   Neuro rec conservative tx  if pain not resolving, will discuss other options  f/u appt in 5 mos  pt gets relief with Gabapentin and Tramadol prn - sending refill today  f/u prn

## 2022-01-12 ENCOUNTER — Other Ambulatory Visit: Payer: Self-pay | Admitting: Family

## 2022-01-12 DIAGNOSIS — E782 Mixed hyperlipidemia: Secondary | ICD-10-CM

## 2022-01-16 ENCOUNTER — Encounter: Payer: Self-pay | Admitting: Neurology

## 2022-01-16 MED ORDER — CARBAMAZEPINE 200 MG PO TABS: ORAL_TABLET | ORAL | 3 refills | Status: DC

## 2022-01-20 ENCOUNTER — Other Ambulatory Visit: Payer: Self-pay | Admitting: Family

## 2022-01-21 ENCOUNTER — Other Ambulatory Visit: Payer: Self-pay | Admitting: Gastroenterology

## 2022-01-23 ENCOUNTER — Other Ambulatory Visit: Payer: Self-pay | Admitting: Gastroenterology

## 2022-03-03 ENCOUNTER — Encounter: Payer: Self-pay | Admitting: *Deleted

## 2022-03-06 ENCOUNTER — Telehealth: Payer: Self-pay | Admitting: Gastroenterology

## 2022-03-06 NOTE — Telephone Encounter (Signed)
Left voicemail for patient to schedule f/u apt for medication refill

## 2022-03-08 ENCOUNTER — Emergency Department (HOSPITAL_BASED_OUTPATIENT_CLINIC_OR_DEPARTMENT_OTHER): Payer: BC Managed Care – PPO | Admitting: Radiology

## 2022-03-08 ENCOUNTER — Emergency Department (HOSPITAL_BASED_OUTPATIENT_CLINIC_OR_DEPARTMENT_OTHER)
Admission: EM | Admit: 2022-03-08 | Discharge: 2022-03-08 | Disposition: A | Discharge status: Home / Self Care | Payer: BC Managed Care – PPO | Attending: Emergency Medicine | Admitting: Emergency Medicine

## 2022-03-08 ENCOUNTER — Encounter (HOSPITAL_BASED_OUTPATIENT_CLINIC_OR_DEPARTMENT_OTHER): Payer: Self-pay | Admitting: Emergency Medicine

## 2022-03-08 DIAGNOSIS — M25561 Pain in right knee: Secondary | ICD-10-CM | POA: Diagnosis not present

## 2022-03-08 MED ORDER — LIDOCAINE HCL (PF) 1 % IJ SOLN
5.0000 mL | Freq: Once | INTRAMUSCULAR | Status: AC
  Administered 2022-03-08: 5 mL
  Filled 2022-03-08: qty 5

## 2022-03-08 MED ORDER — HYDROCODONE-ACETAMINOPHEN 5-325 MG PO TABS: 1.0000 | ORAL_TABLET | Freq: Four times a day (QID) | ORAL | 0 refills | Status: DC | PRN

## 2022-03-08 NOTE — Discharge Instructions (Addendum)
Is follow-up with orthopedics, in 3 days.  Wear the knee immobilizer while ambulating, attempted get a shower chair to help offset the pressure from the pain.  Try did not bear weight on this leg.  Norco as needed, also recommend Celebrex for pain.

## 2022-03-08 NOTE — ED Triage Notes (Signed)
R knee pain since yesterday after doing squats at the gym. She is walking with a limp.

## 2022-03-08 NOTE — ED Provider Triage Note (Signed)
Emergency Medicine Provider Triage Evaluation Note  Madison Esparza , a 51 y.o. female  was evaluated in triage.  Pt complains of R knee pain after split squats 2 days ago. + swelling - no instability no hx of injuries  Review of Systems  Positive: Knee pain  Negative: weakness  Physical Exam  BP (!) 119/95 (BP Location: Right Arm)   Pulse 85   Temp 98 F (36.7 C) (Oral)   Resp 16   Ht 5\' 1"  (1.549 m)   Wt 68.5 kg   SpO2 100%   BMI 28.53 kg/m  Gen:   Awake, no distress   Resp:  Normal effort  MSK:   Moves extremities without difficulty  Other:  R knee pain and swelling  Medical Decision Making  Medically screening exam initiated at 5:43 PM.  Appropriate orders placed.  Urbano Heir was informed that the remainder of the evaluation will be completed by another provider, this initial triage assessment does not replace that evaluation, and the importance of remaining in the ED until their evaluation is complete.  Work up initiated   Margarita Mail, PA-C 03/08/22 1744

## 2022-03-08 NOTE — ED Provider Notes (Signed)
[facility] EM[facility] DEPT Provider Note   CSN: [id number] Arrival date & time: 03/08/22  1728     History  Chief Complaint  Patient presents with   Knee Pain    Madison Esparza is a 51 y.o. female, no past medical history, who presents to the ED secondary to right knee swelling for the last day and a half.  She states that a month ago she kind of tweaked her knee, and was bit tender, she babied it for several weeks, but 2 days ago she was on an exercise class and doing a lot of squatting did well afterwards, but then several hours developed knee pain.  Has had difficulty ambulating on it, very tender, pain with ambulation.  Hett note also notes some swelling of the right knee.  No history of gout to her knowledge.  No trauma.  States that it hurts to twist her leg, and bend it.  Has a lot of difficulty with bending.  Has not had any fevers or chills or abrasions to the site     Home Medications Prior to Admission medications   Medication Sig Start Date End Date Taking? Authorizing Provider  HYDROcodone-acetaminophen (NORCO) 5-325 MG tablet Take 1 tablet by mouth every 6 (six) hours as needed for moderate pain. 03/08/22  Yes Maekayla Giorgio L, PA  carbamazepine (TEGRETOL) 200 MG tablet Take 1 tablet daily x 1 week, then increase to 1 tablet twice daily. 01/16/22   Patel, Arvin Collard K, DO  cyclobenzaprine (FLEXERIL) 5 MG tablet Take 1-2 tablets (5-10 mg total) by mouth 3 (three) times daily as needed for muscle spasms. 12/20/21   Jeanie Sewer, NP  dicyclomine (BENTYL) 20 MG tablet Take 1 tablet (20 mg total) by mouth 4 (four) times daily -  before meals and at bedtime. Pt needs office visit 08/12/21   Thornton Park, MD  docusate sodium (COLACE) 100 MG capsule Take 100 mg by mouth daily.    [provider]  fluticasone Asencion Islam) 50 MCG/ACT nasal spray Use 1 spray(s) in each nostril once daily 10/12/20   Orma Flaming, MD  gabapentin (NEURONTIN) 300 MG capsule Take 2  capsules (600 mg total) by mouth 3 (three) times daily. 11/26/21   Tomi Likens, Adam R, DO  pantoprazole (PROTONIX) 40 MG tablet TAKE 1 TABLET BY MOUTH TWICE DAILY BEFORE MEAL(S) . APPOINTMENT REQUIRED FOR FUTURE REFILLS 12/09/21   Jeanie Sewer, NP  rosuvastatin (CRESTOR) 10 MG tablet Take 1 tablet by mouth once daily 01/13/22   Jeanie Sewer, NP  traMADol (ULTRAM) 50 MG tablet Take 1 tablet (50 mg total) by mouth every 8 (eight) hours as needed. 12/20/21   Jeanie Sewer, NP      Allergies    Motrin [ibuprofen]    Review of Systems   Review of Systems  Musculoskeletal:        +R knee pain  Skin:  Negative for rash.    Physical Exam Updated Vital Signs BP (!) 160/94 (BP Location: Right Arm)   Pulse 79   Temp 98 F (36.7 C) (Oral)   Resp 20   Ht 5\' 1"  (1.549 m)   Wt 68.5 kg   SpO2 99%   BMI 28.53 kg/m  Physical Exam Vitals and nursing note reviewed.  Constitutional:      General: She is not in acute distress.    Appearance: She is well-developed.  HENT:     Head: Normocephalic and atraumatic.  Eyes:     General:  Right eye: No discharge.        Left eye: No discharge.     Conjunctiva/sclera: Conjunctivae normal.  Cardiovascular:     Pulses: Normal pulses.  Pulmonary:     Effort: No respiratory distress.  Musculoskeletal:     Right knee: Swelling and effusion present. No erythema. Decreased range of motion. Tenderness present over the medial joint line and lateral joint line.     Comments: Diffuse knee pain, difficult anterior and posterior drawer 2/2 to reduced ROM  Skin:    Capillary Refill: Capillary refill takes less than 2 seconds.  Neurological:     General: No focal deficit present.     Mental Status: She is alert.     Comments: Clear speech.   Psychiatric:        Behavior: Behavior normal.        Thought Content: Thought content normal.     ED Results / Procedures / Treatments   Labs (all labs ordered are listed, but only abnormal results  are displayed) Labs Reviewed - No data to display  EKG None  Radiology DG Knee Complete 4 Views Right  Result Date: 03/08/2022 CLINICAL DATA:  Right knee pain since yesterday after doing squats at the gym. EXAM: RIGHT KNEE - COMPLETE 4+ VIEW COMPARISON:  None Available. FINDINGS: Mild medial compartment joint space narrowing. No knee joint effusion. No acute fracture or dislocation. IMPRESSION: Mild medial compartment joint space narrowing. Electronically Signed   By: Neita Garnet M.D.   On: 03/08/2022 18:41    Procedures Procedures   Medications Ordered in ED Medications  lidocaine (PF) (XYLOCAINE) 1 % injection 5 mL (5 mLs Infiltration Given 03/08/22 1843)    ED Course/ Medical Decision Making/ A&P                           Medical Decision Making Amount and/or Complexity of Data Reviewed Radiology: ordered.  Risk Prescription drug management.   Patient is 51 year old female, here for right knee pain, has difficulty walking on it, after doing squats the other day.  Reports swelling of knee.  Knee is not hot or cool, red.  It is swollen however.  Offered her therapeutic arthrocentesis, patient declined.  X-ray did not show effusion, however based on exam, concerning for possible effusion.  Swelling may be reactive from arthritis, or secondary to possible meniscal tear as patient has impressive swelling.  Placed in Neofel immobilizer, given crutches for relief.  Given Norco which improved pain.  Discharged with Norco.  Opioid reviewed discussed importance of following up with orthopedics outpatient for diagnostic/therapeutic arthrocentesis, further pain control.. Final Clinical Impression(s) / ED Diagnoses Final diagnoses:  Acute pain of right knee    Rx / DC Orders ED Discharge Orders          Ordered    HYDROcodone-acetaminophen (NORCO) 5-325 MG tablet  Every 6 hours PRN        03/08/22 1920              Pete Pelt, PA 03/09/22 0115    Glynn Octave,  MD 03/09/22 [phone]

## 2022-03-10 DIAGNOSIS — M25561 Pain in right knee: Secondary | ICD-10-CM | POA: Diagnosis not present

## 2022-03-12 DIAGNOSIS — S8391XA Sprain of unspecified site of right knee, initial encounter: Secondary | ICD-10-CM | POA: Diagnosis not present

## 2022-03-13 NOTE — Progress Notes (Signed)
Follow-up Visit   Date: 03/14/2022    Madison Esparza MRN: [id number] DOB: 05-Mar-1971    Madison Esparza is a 51 y.o. right-handed Caucasian female with hyperlipidemia and GERD returning to the clinic for follow-up of left trigeminal neuralgia.  The patient was accompanied to the clinic by self.     IMPRESSION/PLAN: Classic left trigeminal neuralgia with loop of SCA abutting CN V - Previously tried gabapentin 600mg  TID, but due to limited benefit, switched to carbamazepine - Pain is much better controlled on carbamazepine 200mg  BID, however, she reports new onset headaches and nausea - I will switch her to oxcarbamazepine 150mg  twice daily to see if her nausea and headaches may be medication side effects  Return to clinic in 3 months  --------------------------------------------- History of present illness: She had right skin rash involving the inside of her mouth, upper lips, and ear in October of 2021.  She was diagnosed with shingles.  At the same time, she recalls having left sided jar, teeth, and tongue pain.  Pain is described as needle-like and shooting, lasting a few seconds. Eating, drinking, brushing teeth, cold air and talking triggers her pain.  Pain lasted from October - January 2021 and then subsided.  She was doing well for about 6 months and then left sided pain returned.  She was taking gabapentin 300mg  three times daily, but able to reduce to twice daily.  She currently does not have any pain.   She drinks at least 2 glasses of wine nightly.  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  UPDATE 03/14/2022: She is here for follow-up visit.  During the summer, she had acute worsening of facial pain and gabapentin was increased to 600mg  TID.  She did not have significant benefit and switiched to carbamazepine.  Currently, she takes carbamazepine 200mg  BID and reports pain now only occurs when eating.  However, over the past month, she has noticed a significant amount of nausea, which  results in poor appetite.   She also has a daily dull headache which starts in the afternoon and lasts all day.  She does not treat the pain, as it is more of an annoyance.    Medications:  Current Outpatient Medications on File Prior to Visit  Medication Sig Dispense Refill   docusate sodium (COLACE) 100 MG capsule Take 100 mg by mouth daily.     fluticasone (FLONASE) 50 MCG/ACT nasal spray Use 1 spray(s) in each nostril once daily 16 g 0   pantoprazole (PROTONIX) 40 MG tablet TAKE 1 TABLET BY MOUTH TWICE DAILY BEFORE MEAL(S) . APPOINTMENT REQUIRED FOR FUTURE REFILLS 180 tablet 0   rosuvastatin (CRESTOR) 10 MG tablet Take 1 tablet by mouth once daily 90 tablet 0   traMADol (ULTRAM) 50 MG tablet Take 1 tablet (50 mg total) by mouth every 8 (eight) hours as needed. 30 tablet 1   cyclobenzaprine (FLEXERIL) 5 MG tablet Take 1-2 tablets (5-10 mg total) by mouth 3 (three) times daily as needed for muscle spasms. (Patient not taking: Reported on 03/14/2022) 60 tablet 1   dicyclomine (BENTYL) 20 MG tablet Take 1 tablet (20 mg total) by mouth 4 (four) times daily -  before meals and at bedtime. Pt needs office visit (Patient not taking: Reported on 03/14/2022) 120 tablet 0   HYDROcodone-acetaminophen (NORCO) 5-325 MG tablet Take 1 tablet by mouth every 6 (six) hours as needed for moderate pain. (Patient not taking: Reported on 03/14/2022) 12 tablet 0   No current facility-administered medications  on file prior to visit.    Allergies:  Allergies  Allergen Reactions   Motrin [Ibuprofen] Nausea Only    Vital Signs:  BP 134/84   Pulse 88   Ht 5\' 1"  (1.549 m)   Wt 158 lb (71.7 kg)   SpO2 100%   BMI 29.85 kg/m    Neurological Exam: MENTAL STATUS including orientation to time, place, person, recent and remote memory, attention span and concentration, language, and fund of knowledge is normal.  Speech is not dysarthric.  CRANIAL NERVES:   Pupils equal round and reactive to light.  Normal conjugate,  extra-ocular eye movements in all directions of gaze.  No ptosis.  Face is symmetric. Facial sensation intact.  MOTOR:  Motor strength is 5/5 in all extremities, right leg supported in brace due to recent injury.   No pronator drift.  Tone is normal.    COORDINATION/GAIT:  Normal finger-to- nose-finger.  Intact rapid alternating movements bilaterally.  Gait appears antalgic, supported with crutch  Data: MRI face 09/03/2021: A loop of the left anterior inferior cerebellar artery abuts the left trigeminal nerve at the root entry zone. Otherwise, unremarkable MRI of the face/trigeminal nerves.   Thank you for allowing me to participate in patient's care.  If I can answer any additional questions, I would be pleased to do so.    Sincerely,    Ligia Duguay K. Posey Pronto, DO

## 2022-03-14 ENCOUNTER — Ambulatory Visit: Payer: BC Managed Care – PPO | Admitting: Neurology

## 2022-03-14 ENCOUNTER — Encounter: Payer: Self-pay | Admitting: Neurology

## 2022-03-14 VITALS — BP 134/84 | HR 88 | Ht 61.0 in | Wt 158.0 lb

## 2022-03-14 DIAGNOSIS — G500 Trigeminal neuralgia: Secondary | ICD-10-CM | POA: Diagnosis not present

## 2022-03-14 MED ORDER — OXCARBAZEPINE 150 MG PO TABS: 150.0000 mg | ORAL_TABLET | Freq: Two times a day (BID) | ORAL | 5 refills | Status: DC

## 2022-03-14 NOTE — Patient Instructions (Signed)
Start oxcarbamazepine 150mg  twice daily  Return to clinic in 3 months

## 2022-03-18 ENCOUNTER — Encounter: Payer: Self-pay | Admitting: Neurology

## 2022-03-24 MED ORDER — GABAPENTIN 300 MG PO CAPS: ORAL_CAPSULE | ORAL | 5 refills | Status: DC

## 2022-03-24 MED ORDER — CARBAMAZEPINE 200 MG PO TABS: 200.0000 mg | ORAL_TABLET | Freq: Two times a day (BID) | ORAL | 5 refills | Status: DC

## 2022-03-25 DIAGNOSIS — M25661 Stiffness of right knee, not elsewhere classified: Secondary | ICD-10-CM | POA: Diagnosis not present

## 2022-03-25 DIAGNOSIS — M25561 Pain in right knee: Secondary | ICD-10-CM | POA: Diagnosis not present

## 2022-04-03 DIAGNOSIS — M25661 Stiffness of right knee, not elsewhere classified: Secondary | ICD-10-CM | POA: Diagnosis not present

## 2022-04-03 DIAGNOSIS — M25561 Pain in right knee: Secondary | ICD-10-CM | POA: Diagnosis not present

## 2022-04-05 ENCOUNTER — Other Ambulatory Visit: Payer: Self-pay | Admitting: Family

## 2022-04-09 DIAGNOSIS — M25661 Stiffness of right knee, not elsewhere classified: Secondary | ICD-10-CM | POA: Diagnosis not present

## 2022-04-09 DIAGNOSIS — M25561 Pain in right knee: Secondary | ICD-10-CM | POA: Diagnosis not present

## 2022-04-16 DIAGNOSIS — S8391XA Sprain of unspecified site of right knee, initial encounter: Secondary | ICD-10-CM | POA: Diagnosis not present

## 2022-04-16 DIAGNOSIS — M25661 Stiffness of right knee, not elsewhere classified: Secondary | ICD-10-CM | POA: Diagnosis not present

## 2022-04-16 DIAGNOSIS — M25561 Pain in right knee: Secondary | ICD-10-CM | POA: Diagnosis not present

## 2022-04-23 DIAGNOSIS — M25661 Stiffness of right knee, not elsewhere classified: Secondary | ICD-10-CM | POA: Diagnosis not present

## 2022-04-23 DIAGNOSIS — M25561 Pain in right knee: Secondary | ICD-10-CM | POA: Diagnosis not present

## 2022-05-06 ENCOUNTER — Encounter: Payer: Self-pay | Admitting: Gastroenterology

## 2022-05-06 ENCOUNTER — Other Ambulatory Visit (INDEPENDENT_AMBULATORY_CARE_PROVIDER_SITE_OTHER): Payer: BC Managed Care – PPO

## 2022-05-06 ENCOUNTER — Ambulatory Visit: Payer: BC Managed Care – PPO | Admitting: Gastroenterology

## 2022-05-06 VITALS — BP 126/72 | HR 70 | Ht 61.0 in | Wt 158.0 lb

## 2022-05-06 DIAGNOSIS — K219 Gastro-esophageal reflux disease without esophagitis: Secondary | ICD-10-CM | POA: Diagnosis not present

## 2022-05-06 DIAGNOSIS — R140 Abdominal distension (gaseous): Secondary | ICD-10-CM

## 2022-05-06 DIAGNOSIS — R09A2 Foreign body sensation, throat: Secondary | ICD-10-CM

## 2022-05-06 DIAGNOSIS — D509 Iron deficiency anemia, unspecified: Secondary | ICD-10-CM

## 2022-05-06 LAB — IBC + FERRITIN
Ferritin: 130.4 ng/mL (ref 10.0–291.0)
Iron: 109 ug/dL (ref 42–145)
Saturation Ratios: 34.6 % (ref 20.0–50.0)
TIBC: 315 ug/dL (ref 250.0–450.0)
Transferrin: 225 mg/dL (ref 212.0–360.0)

## 2022-05-06 LAB — CBC WITH DIFFERENTIAL/PLATELET
Basophils Absolute: 0 10*3/uL (ref 0.0–0.1)
Basophils Relative: 0.6 % (ref 0.0–3.0)
Eosinophils Absolute: 0.1 10*3/uL (ref 0.0–0.7)
Eosinophils Relative: 1.8 % (ref 0.0–5.0)
HCT: 34.5 % — ABNORMAL LOW (ref 36.0–46.0)
Hemoglobin: 11.4 g/dL — ABNORMAL LOW (ref 12.0–15.0)
Lymphocytes Relative: 34.4 % (ref 12.0–46.0)
Lymphs Abs: 1.8 10*3/uL (ref 0.7–4.0)
MCHC: 33 g/dL (ref 30.0–36.0)
MCV: 73.9 fl — ABNORMAL LOW (ref 78.0–100.0)
Monocytes Absolute: 0.4 10*3/uL (ref 0.1–1.0)
Monocytes Relative: 8.3 % (ref 3.0–12.0)
Neutro Abs: 2.8 10*3/uL (ref 1.4–7.7)
Neutrophils Relative %: 54.9 % (ref 43.0–77.0)
Platelets: 259 10*3/uL (ref 150.0–400.0)
RBC: 4.67 Mil/uL (ref 3.87–5.11)
RDW: 16 % — ABNORMAL HIGH (ref 11.5–15.5)
WBC: 5.2 10*3/uL (ref 4.0–10.5)

## 2022-05-06 MED ORDER — DICYCLOMINE HCL 20 MG PO TABS: 20.0000 mg | ORAL_TABLET | Freq: Three times a day (TID) | ORAL | 3 refills | Status: DC

## 2022-05-06 MED ORDER — PANTOPRAZOLE SODIUM 40 MG PO TBEC: 40.0000 mg | DELAYED_RELEASE_TABLET | Freq: Two times a day (BID) | ORAL | 3 refills | Status: DC

## 2022-05-06 NOTE — Progress Notes (Signed)
Referring Provider: Dulce Sellar, NP Primary Care Physician:  Dulce Sellar, NP  Chief Complaint: globus, abdominal pain, bloating   IMPRESSION:  Epigastric pain and bloating     - not improved on PPI therapy    - slight improvement with FDGard    - controlled with dicyclomine Biopsy-proved reflux, improved on pantoprazole    - no eosinophilic esophagitis Globus, resolved on PPI BID History of iron deficiency anemia    - labs 08/24/19 show normal iron, ferritin, hemoglobin but persistent microcytosis    - reports lifetime history of anemia and family history    - not previously seen by hematology    - last period 6/22    - no overt bleeding Cholelithiasis, seen on ultrasound  Hiatal hernia (per patient report) Suspected fatty liver on ultrasound with normal liver enzymes Anal fistula with patient reported normal colonoscopy 5 years ago in [place]    - unsuccessful attempt to obtain those records  Epigastric pain, bloating, and flatus: Not improved with PPI therapy or FDGard despite mild reflux on EGD. No obvious source on EGD, ultrasound, or HIDA. Differential includes altered gastric accomodation, SIBO, and functional dyspepsia. We also discussed the possibility of fructose intolerance.   Globus: Reflux seen on EGD in 2021. There was no EOE, inlet patch or intestinal metaplasia. Continue pantoprazole.   History of iron deficiency anemia with recent decline in hemoglobin without overt bleeding.   Colon cancer screening: Needs colonoscopy in 4-5 years given her history. Will continue to work to obtain those results.     PLAN: - Continue pantoprazole 40 mg BID - Resume dicyclomine 20 mg QID - Discussed considering food allergy testing referral - CBC, iron/ferritin  - Follow-up in 3 months, earlier if needed - Will attempt to obtain colonoscopy reports [facility] [place], [place] from 4-5 years ago  HPI: RONALDA WALPOLE is a 51 y.o. female who returns  in follow-up. She was last seen 10/31/20.  The interval history is obtained through the patient and review of her electronic health record.  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   Longstanding history of sensitive stomach. Diagnosed clinically with a hiatal hernia on presentation to the ED 7-8 years ago for atypical chest pain. She was not treated with prescription medications and started Nexium OTC.   Colonoscopy 6-7 years ago in [place] for an anal fistula. Physician who performed the exam had his license revoked and she has been unable to obtain and records from that evaluation. No known history of polyps.   Seen by Dr. Artis Flock 08/2019 with concerns for worsening reflux. Abdominal ultrasound at that time showed cholelithiasis without biliary dilatation and suspected fatty liver.  Reflux symptoms improved on prescription pantoprazole. However, she continued to have bloating and epigastric pain.  In June she developed globus sensation present when awake.  Red wine is her escape and she hopes that she doesn't have to give it up.   Labs 08/24/19 show a normal CMP, iron 96, ferritin 213, WBC 3.6, hgb 12.1, MCV 76.1, RDW 16, platelets 247  EGD 05/23/20 showed mild, biopsy-proven reflux. There was no EOE, inlet patch or intestinal metaplasia.   At the time of her follow-up visit 07/31/20 she reported well-controlled symptoms on pantoprazole 40 mg BID without any breakthrough symptoms. Globus has resolved. Primary concern was bloating and epigastric abdominal pain that is most severe in the evening. Associated flatus.  She was avoiding high-acid foods. She was following a keto diet but found it hard to stick to the diet.   She had identified raw vegetables as a trigger.   HIDA scan with [facility] 09/06/2020 was normal with a calculated gallbladder ejection fraction of 80%.  At the time of her visit 5/25/222 she reported ongoing  epigastric and upper abdominal pain pains particularly when eating raw vegetables and certain fruits.  She was asked to try FD guard 2 capsules taken twice daily for 4 weeks this may have helped with the bloating but not with the other symptoms. She continue to have alternating diarrhea and constipation. She really want to be able to eat fruits in vegetables and she feels like she is gaining weight as she avoid those foods.   Normal CMP 04/17/21. Hemoglobin 11.1, MCV 74.3, RDW 16.2, platelets 250 Iron and ferritin were normal in 2021 but her hemoglobin was 12.1 at that time  Returns today with concerns for abdominal pain and bloating if she eats the wrong foods as well as a reflux with a globus sensation. She will have reflux symptoms if she forgets to take her pantoprazole. She has been off the dicyclomine and thinks resuming treatment will control the symptoms again.   She reports a lifelong history of iron deficiency and a strong family history of iron deficiency. She has never seen a hematologist but reports testing during her pregnancy.  No overt bleeding. Last period June 2022.    She is headed to the gym this morning - she has been doing weights and HIIT classes.  Past Medical History:  Diagnosis Date   Allergy    Anemia    IDA   Annual physical exam 04/17/2021   Anxiety    Hyperlipidemia    Intermenstrual bleeding 07/25/2020    Past Surgical History:  Procedure Laterality Date   COLONOSCOPY     5 years ago   DILATION AND CURETTAGE OF UTERUS      Current Outpatient Medications  Medication Sig Dispense Refill   carbamazepine (TEGRETOL) 200 MG tablet Take 1 tablet (200 mg total) by mouth 2 (two) times daily. 60 tablet 5   docusate sodium (COLACE) 100 MG capsule Take 100 mg by mouth daily.     fluticasone (FLONASE) 50 MCG/ACT nasal spray Use 1 spray(s) in each nostril once daily 16 g 0   gabapentin (NEURONTIN) 300 MG capsule Take 1 tablet twice daily.  OK to take extra tablet daily  as needed. 90 capsule 5   rosuvastatin (CRESTOR) 10 MG tablet Take 1 tablet by mouth once daily 90 tablet 0   traMADol (ULTRAM) 50 MG tablet Take 1 tablet (50 mg total) by mouth every 8 (eight) hours as needed. 30 tablet 1   dicyclomine (BENTYL) 20 MG tablet Take 1 tablet (20 mg total) by mouth 4 (four) times daily -  before meals and at bedtime. Pt needs office visit 360 tablet 3   pantoprazole (PROTONIX) 40 MG tablet Take 1 tablet (40 mg total) by mouth 2 (two) times daily before a meal. 180 tablet 3   No current facility-administered medications for this visit.    Allergies as of 05/06/2022 - Review Complete 05/06/2022  Allergen Reaction Noted   Motrin [ibuprofen] Nausea Only 06/30/2018    Physical Exam: General:   Alert,  well-nourished, pleasant and cooperative in NAD Head:  Normocephalic and atraumatic. Eyes:  Sclera clear, no icterus.   Conjunctiva pink. Abdomen:  Soft, nontender, nondistended, normal bowel sounds,  no rebound or guarding. No hepatosplenomegaly. Neurologic:  Alert and  oriented x4;  grossly nonfocal Skin:  Intact without significant lesions or rashes. Psych:  Alert and cooperative. Normal mood and affect.    Jermane Brayboy L. Orvan Falconer, MD, MPH 05/06/2022, 10:04 AM

## 2022-05-06 NOTE — Patient Instructions (Addendum)
We discussed the chin tuck technique to make it easier to swallow your medications.  We will resume dicyclomine. Please let me know if this doesn't help.  Please stop in the lab this morning to check your iron levels. Your provider has requested that you go to the basement level for lab work before leaving today. Press "B" on the elevator. The lab is located at the first door on the left as you exit the elevator.  _______________________________________________________  If you are age 64 or older, your body mass index should be between 23-30. Your Body mass index is 29.85 kg/m. If this is out of the aforementioned range listed, please consider follow up with your Primary Care Provider.  If you are age 40 or younger, your body mass index should be between 19-25. Your Body mass index is 29.85 kg/m. If this is out of the aformentioned range listed, please consider follow up with your Primary Care Provider.   ________________________________________________________  The [facility] providers would like to encourage you to use [facility] to communicate with providers for non-urgent requests or questions.  Due to long hold times on the telephone, sending your provider a message by [facility] may be a faster and more efficient way to get a response.  Please allow 48 business hours for a response.  Please remember that this is for non-urgent requests.  _______________________________________________________

## 2022-05-20 DIAGNOSIS — Z01419 Encounter for gynecological examination (general) (routine) without abnormal findings: Secondary | ICD-10-CM | POA: Diagnosis not present

## 2022-05-20 DIAGNOSIS — R6882 Decreased libido: Secondary | ICD-10-CM | POA: Diagnosis not present

## 2022-05-22 ENCOUNTER — Encounter: Payer: Self-pay | Admitting: *Deleted

## 2022-06-25 ENCOUNTER — Encounter: Payer: Self-pay | Admitting: Neurology

## 2022-06-26 ENCOUNTER — Telehealth (INDEPENDENT_AMBULATORY_CARE_PROVIDER_SITE_OTHER): Payer: BC Managed Care – PPO | Admitting: Neurology

## 2022-06-26 ENCOUNTER — Encounter: Payer: Self-pay | Admitting: Neurology

## 2022-06-26 DIAGNOSIS — G500 Trigeminal neuralgia: Secondary | ICD-10-CM

## 2022-06-26 MED ORDER — PREGABALIN 50 MG PO CAPS: 50.0000 mg | ORAL_CAPSULE | Freq: Three times a day (TID) | ORAL | 2 refills | Status: DC

## 2022-06-26 MED ORDER — TRAMADOL HCL 50 MG PO TABS: 50.0000 mg | ORAL_TABLET | Freq: Two times a day (BID) | ORAL | 0 refills | Status: DC | PRN

## 2022-06-26 NOTE — Progress Notes (Signed)
   Virtual Visit via Video Note The purpose of this virtual visit is to provide medical care while limiting exposure to the novel coronavirus.    Consent was obtained for video visit:  Yes.   Answered questions that patient had about telehealth interaction:  Yes.   I discussed the limitations, risks, security and privacy concerns of performing an evaluation and management service by telemedicine. I also discussed with the patient that there may be a patient responsible charge related to this service. The patient expressed understanding and agreed to proceed.  Pt location: Home Physician Location: office Name of referring provider:  Jeanie Sewer, NP I connected with Urbano Heir at patients initiation/request on 06/26/2022 at 12:45 PM EST by video enabled telemedicine application and verified that I am speaking with the correct person using two identifiers. Pt MRN:  [id number] Pt DOB:  08-09-1970 Video Participants:  Urbano Heir   History of Present Illness: This is a 52 y.o. female returning for follow-up of left trigeminal neuralgia.  Since her last visit in October, she has been compliant with gabapentin 300mg  TID and carbamazepine 200mg  and was doing well.  She was having episodic breakthrough pain with brushing or eating, but it was self-limited.  She was pain-free for about 3 weeks over Chistmas. Over the past week, her left facial pain has intensified and occurring daily.  She only gets temporary relief with tramadol. She had old prescription of tramadol for shingles, which she is now taking daily.    Observations/Objective:   Vitals:   06/26/22 1149  Weight: 155 lb (70.3 kg)  Height: 5\' 1"  (1.549 m)   Patient is awake, alert, and appears comfortable.  Oriented x 4.   Extraocular muscles are intact. No ptosis.  Face is symmetric.  Speech is not dysarthric.   Assessment and Plan:  Classic left trigeminal neuralgia with loop of SCA abutting CN V, worsening pain  - Previously  tried gabapentin 600mg  TID (no benefit), oxcarbamazepine ( GI side effects)   - Taper off gabapentin 300mg  TID and start Lyrica 50mg  TID  - Continue carbamazepine 200mg  daily (unable to titrate due to nausea)  - Limited quantity of tramadol provided for breakthrough pain   Follow Up Instructions:   I discussed the assessment and treatment plan with the patient. The patient was provided an opportunity to ask questions and all were answered. The patient agreed with the plan and demonstrated an understanding of the instructions.    The patient was advised to call back or seek an in-person evaluation if the symptoms worsen or if the condition fails to improve as anticipated.  Follow-up in 2 weeks    Alda Berthold, DO

## 2022-06-26 NOTE — Telephone Encounter (Signed)
Patient scheduled for virtual visit today.  

## 2022-07-09 ENCOUNTER — Encounter: Payer: Self-pay | Admitting: Neurology

## 2022-07-09 ENCOUNTER — Ambulatory Visit: Payer: BC Managed Care – PPO | Admitting: Neurology

## 2022-07-09 VITALS — BP 134/86 | HR 80 | Ht 61.0 in | Wt 158.0 lb

## 2022-07-09 DIAGNOSIS — G500 Trigeminal neuralgia: Secondary | ICD-10-CM | POA: Diagnosis not present

## 2022-07-09 MED ORDER — PREGABALIN 100 MG PO CAPS: 100.0000 mg | ORAL_CAPSULE | Freq: Two times a day (BID) | ORAL | 5 refills | Status: DC

## 2022-07-09 NOTE — Progress Notes (Signed)
Follow-up Visit   Date: 07/09/2022    Madison Esparza MRN: [id number] DOB: [birth year]    Madison Esparza is a 52 y.o. right-handed Caucasian female with hyperlipidemia and GERD returning to the clinic for follow-up of left trigeminal neuralgia.  The patient was accompanied to the clinic by self.     IMPRESSION/PLAN: Classic left trigeminal neuralgia with loop of SCA abutting CN VI, worsening pain             - Previously tried gabapentin 600mg  TID (no benefit), oxcarbamazepine ( GI side effects)   - Increase Lyrica 100mg  twice daily  - Stop gabapentin  - Going forward, plan to stop carbamazepine and optimize Lyrica  - OK to use tramadol for severe pain  - MyChart update in 1 week  Return to clinic in 2 months  --------------------------------------------- History of present illness: She had right skin rash involving the inside of her mouth, upper lips, and ear in October of 2021.  She was diagnosed with shingles.  At the same time, she recalls having left sided jar, teeth, and tongue pain.  Pain is described as needle-like and shooting, lasting a few seconds. Eating, drinking, brushing teeth, cold air and talking triggers her pain.  Pain lasted from October - January 2021 and then subsided.  She was doing well for about 6 months and then left sided pain returned.  She was taking gabapentin 300mg  three times daily, but able to reduce to twice daily.  She currently does not have any pain.   She drinks at least 2 glasses of wine nightly.  She works as a Theme park manager.  UPDATE 03/14/2022: She is here for follow-up visit.  During the summer, she had acute worsening of facial pain and gabapentin was increased to 600mg  TID.  She did not have significant benefit and switiched to carbamazepine.  Currently, she takes carbamazepine 200mg  BID and reports pain now only occurs when eating.  However, over the past month, she has noticed a significant amount of nausea, which results in poor appetite.    She also has a daily dull headache which starts in the afternoon and lasts all day.  She does not treat the pain, as it is more of an annoyance.    UPDATE 07/09/2022:  She is here for follow-up.  She has been tapering off gabapentin and will be finished tomorrow.  She is tolerating Lyrica 50mg  TID, however, has not appreciated a significant relief in pain.  She has noticed that pain is much less when she is not talking, however, her works involves speaking, so it is hard to balance on days that she is working.  She has been taking tramadol 2-3 times per week for severe pain, which does help.    Medications:  Current Outpatient Medications on File Prior to Visit  Medication Sig Dispense Refill   carbamazepine (TEGRETOL) 200 MG tablet Take 1 tablet (200 mg total) by mouth 2 (two) times daily. 60 tablet 5   dicyclomine (BENTYL) 20 MG tablet Take 1 tablet (20 mg total) by mouth 4 (four) times daily -  before meals and at bedtime. Pt needs office visit 360 tablet 3   docusate sodium (COLACE) 100 MG capsule Take 100 mg by mouth daily.     fluticasone (FLONASE) 50 MCG/ACT nasal spray Use 1 spray(s) in each nostril once daily 16 g 0   gabapentin (NEURONTIN) 300 MG capsule Take 1 tablet twice daily.  OK to take extra tablet daily as needed. (  Patient taking differently: 3 (three) times daily. Once a day) 90 capsule 5   pantoprazole (PROTONIX) 40 MG tablet Take 1 tablet (40 mg total) by mouth 2 (two) times daily before a meal. 180 tablet 3   pregabalin (LYRICA) 50 MG capsule Take 1 capsule (50 mg total) by mouth 3 (three) times daily. 90 capsule 2   rosuvastatin (CRESTOR) 10 MG tablet Take 1 tablet by mouth once daily (Patient taking differently: Take one tablet three times a week) 90 tablet 0   traMADol (ULTRAM) 50 MG tablet Take 1 tablet (50 mg total) by mouth every 12 (twelve) hours as needed. 20 tablet 0   No current facility-administered medications on file prior to visit.    Allergies:  Allergies   Allergen Reactions   Motrin [Ibuprofen] Nausea Only    Vital Signs:  BP 134/86   Pulse 80   Ht 5\' 1"  (1.549 m)   Wt 158 lb (71.7 kg)   SpO2 98%   BMI 29.85 kg/m    Neurological Exam: MENTAL STATUS including orientation to time, place, person, recent and remote memory, attention span and concentration, language, and fund of knowledge is normal.  Speech is not dysarthric.  CRANIAL NERVES:   Pupils equal round and reactive to light.  Normal conjugate, extra-ocular eye movements in all directions of gaze.  No ptosis.  Face is symmetric. Facial sensation intact.  MOTOR:  Motor strength is 5/5 in all extremities.   No pronator drift.  Tone is normal.    COORDINATION/GAIT:  Normal finger-to- nose-finger.  Intact rapid alternating movements bilaterally.  Gait not tested  Data: MRI face 09/03/2021: A loop of the left anterior inferior cerebellar artery abuts the left trigeminal nerve at the root entry zone. Otherwise, unremarkable MRI of the face/trigeminal nerves.   Thank you for allowing me to participate in patient's care.  If I can answer any additional questions, I would be pleased to do so.    Sincerely,    Exodus Kutzer K. Posey Pronto, DO

## 2022-07-09 NOTE — Patient Instructions (Addendum)
Increase Lyrica 100mg  twice daily  Stop gabapentin  MyChart update in 1 week  Return to clinic in 2 months

## 2022-07-15 ENCOUNTER — Other Ambulatory Visit: Payer: Self-pay | Admitting: Family

## 2022-07-15 DIAGNOSIS — E782 Mixed hyperlipidemia: Secondary | ICD-10-CM

## 2022-07-17 ENCOUNTER — Other Ambulatory Visit: Payer: Self-pay | Admitting: Neurology

## 2022-07-17 MED ORDER — PREGABALIN 100 MG PO CAPS: ORAL_CAPSULE | ORAL | 5 refills | Status: DC

## 2022-07-17 NOTE — Addendum Note (Signed)
Addended by: Alda Berthold on: 07/17/2022 11:43 AM   Modules accepted: Orders

## 2022-07-22 MED ORDER — CARBAMAZEPINE 200 MG PO TABS: 100.0000 mg | ORAL_TABLET | Freq: Two times a day (BID) | ORAL | 5 refills | Status: DC

## 2022-07-22 NOTE — Addendum Note (Signed)
Addended by: Alda Berthold on: 07/22/2022 03:12 PM   Modules accepted: Orders

## 2022-07-29 ENCOUNTER — Other Ambulatory Visit: Payer: Self-pay | Admitting: Family

## 2022-07-29 DIAGNOSIS — E782 Mixed hyperlipidemia: Secondary | ICD-10-CM

## 2022-08-13 DIAGNOSIS — R6882 Decreased libido: Secondary | ICD-10-CM | POA: Diagnosis not present

## 2022-08-27 MED ORDER — PREGABALIN 200 MG PO CAPS: 200.0000 mg | ORAL_CAPSULE | Freq: Two times a day (BID) | ORAL | 5 refills | Status: DC

## 2022-08-27 NOTE — Addendum Note (Signed)
Addended by: Alda Berthold on: 08/27/2022 09:24 AM   Modules accepted: Orders

## 2022-09-09 ENCOUNTER — Encounter: Payer: Self-pay | Admitting: Neurology

## 2022-09-09 ENCOUNTER — Ambulatory Visit: Payer: BC Managed Care – PPO | Admitting: Neurology

## 2022-09-09 VITALS — BP 128/86 | HR 90 | Ht 61.0 in | Wt 157.0 lb

## 2022-09-09 DIAGNOSIS — G500 Trigeminal neuralgia: Secondary | ICD-10-CM

## 2022-09-09 MED ORDER — DULOXETINE HCL 30 MG PO CPEP: 30.0000 mg | ORAL_CAPSULE | Freq: Every day | ORAL | 3 refills | Status: DC

## 2022-09-09 MED ORDER — PREGABALIN 100 MG PO CAPS: ORAL_CAPSULE | ORAL | 1 refills | Status: DC

## 2022-09-09 NOTE — Progress Notes (Signed)
Follow-up Visit   Date: 09/09/2022    Madison Esparza MRN: [id number] DOB: [birth year]    Madison Esparza is a 52 y.o. right-handed Caucasian female with hyperlipidemia and GERD returning to the clinic for follow-up of left trigeminal neuralgia.  The patient was accompanied to the clinic by self.     IMPRESSION/PLAN: Classic left trigeminal neuralgia with loop of SCA abutting CN VI, worsening pain             - Previously tried gabapentin 600mg  TID (no benefit), oxcarbamazepine ( GI side effects), Lyrica 200mg  BID (cognitive side effects, no benefit)  - Taper off Lyrica by reducing by 100mg  each week - Start Cymbalta 30mg  daily - Continue carbamazepine 100mg  in the morning and 200mg  at bedtime (titration limited by nausea) - Refer to [facility] neurosurgery for second opinion  Return to clinic in 3 months  --------------------------------------------- History of present illness: She had right skin rash involving the inside of her mouth, upper lips, and ear in October of 2021.  She was diagnosed with shingles.  At the same time, she recalls having left sided jar, teeth, and tongue pain.  Pain is described as needle-like and shooting, lasting a few seconds. Eating, drinking, brushing teeth, cold air and talking triggers her pain.  Pain lasted from October - January 2021 and then subsided.  She was doing well for about 6 months and then left sided pain returned.  She was taking gabapentin 300mg  three times daily, but able to reduce to twice daily.  She currently does not have any pain.   She drinks at least 2 glasses of wine nightly.  She works as a Theme park manager.  UPDATE 03/14/2022: She is here for follow-up visit.  During the summer, she had acute worsening of facial pain and gabapentin was increased to 600mg  TID.  She did not have significant benefit and switiched to carbamazepine.  Currently, she takes carbamazepine 200mg  BID and reports pain now only occurs when eating.  However, over  the past month, she has noticed a significant amount of nausea, which results in poor appetite.   She also has a daily dull headache which starts in the afternoon and lasts all day.  She does not treat the pain, as it is more of an annoyance.    UPDATE 07/09/2022:  She is here for follow-up.  She has been tapering off gabapentin and will be finished tomorrow.  She is tolerating Lyrica 50mg  TID, however, has not appreciated a significant relief in pain.  She has noticed that pain is much less when she is not talking, however, her works involves speaking, so it is hard to balance on days that she is working.  She has been taking tramadol 2-3 times per week for severe pain, which does help.   UPDATE 09/09/2022: She is here for follow-up visit and reports ongoing pain involving the left trigeminal nerve.  She takes Lyrica 200mg  twice daily and carbamazepine 100mg  in the morning and 200mg  at bedtime.  She has not appreciated any improvement. Pain is always exacerbated by chewing and talking.  She endorses feeling tired and having cognitive side effects ("zombie") after increasing Lyrica to 200mg  twice daily.    Medications:  Current Outpatient Medications on File Prior to Visit  Medication Sig Dispense Refill   carbamazepine (TEGRETOL) 200 MG tablet Take 0.5 tablets (100 mg total) by mouth in the morning and at bedtime. 30 tablet 5   dicyclomine (BENTYL) 20 MG tablet Take 1  tablet (20 mg total) by mouth 4 (four) times daily -  before meals and at bedtime. Pt needs office visit 360 tablet 3   docusate sodium (COLACE) 100 MG capsule Take 100 mg by mouth daily.     fluticasone (FLONASE) 50 MCG/ACT nasal spray Use 1 spray(s) in each nostril once daily 16 g 0   pantoprazole (PROTONIX) 40 MG tablet Take 1 tablet (40 mg total) by mouth 2 (two) times daily before a meal. 180 tablet 3   pregabalin (LYRICA) 200 MG capsule Take 1 capsule (200 mg total) by mouth 2 (two) times daily. 60 capsule 5   traMADol (ULTRAM) 50  MG tablet Take 1 tablet (50 mg total) by mouth every 12 (twelve) hours as needed. 20 tablet 0   rosuvastatin (CRESTOR) 10 MG tablet Take 1 tablet by mouth once daily (Patient not taking: Reported on 09/09/2022) 90 tablet 0   No current facility-administered medications on file prior to visit.    Allergies:  Allergies  Allergen Reactions   Motrin [Ibuprofen] Nausea Only    Vital Signs:  BP 128/86   Pulse 90   Ht 5\' 1"  (1.549 m)   Wt 157 lb (71.2 kg)   SpO2 98%   BMI 29.66 kg/m    Neurological Exam: MENTAL STATUS including orientation to time, place, person, recent and remote memory, attention span and concentration, language, and fund of knowledge is normal.  Speech is not dysarthric.  CRANIAL NERVES:   Pupils equal round and reactive to light.  Normal conjugate, extra-ocular eye movements in all directions of gaze.  No ptosis.  Face is symmetric. Facial sensation intact.  MOTOR:  Motor strength is 5/5 in all extremities.   No pronator drift.  Tone is normal.    COORDINATION/GAIT:  Normal finger-to- nose-finger.  Intact rapid alternating movements bilaterally.  Gait not tested  Data: MRI face 09/03/2021: A loop of the left anterior inferior cerebellar artery abuts the left trigeminal nerve at the root entry zone. Otherwise, unremarkable MRI of the face/trigeminal nerves.   Thank you for allowing me to participate in patient's care.  If I can answer any additional questions, I would be pleased to do so.    Sincerely,    Treonna Klee K. Posey Pronto, DO

## 2022-09-09 NOTE — Patient Instructions (Signed)
Lyrica 100mg  tablets - Take 1 tablet in the morning and 2 tablets at bedtime x 1 week, then reduce to 1 tablet twice daily x 1 week, then 1 tablet at bedtime x 1 week, then stop.  When you are on Lyrica 100mg  at bedtime, start Cymbalta 30mg  daily.  Continue to take carbamazepine 100mg  in the morning and 200mg  at bedtime.  We will refer you to trigeminal neuralgia specialist  Return to clinic in 3 months

## 2022-09-10 ENCOUNTER — Telehealth: Payer: Self-pay

## 2022-09-10 DIAGNOSIS — G500 Trigeminal neuralgia: Secondary | ICD-10-CM

## 2022-09-10 NOTE — Telephone Encounter (Signed)
-----   Message from Alda Berthold, DO sent at 09/09/2022 12:37 PM EDT ----- Please refer pt to [facility]. Lilyan Punt neurosurgery for left trigeminal neuralgia. Thanks.

## 2022-10-08 DIAGNOSIS — G500 Trigeminal neuralgia: Secondary | ICD-10-CM | POA: Diagnosis not present

## 2022-10-23 DIAGNOSIS — Z01818 Encounter for other preprocedural examination: Secondary | ICD-10-CM | POA: Diagnosis not present

## 2022-10-23 DIAGNOSIS — D508 Other iron deficiency anemias: Secondary | ICD-10-CM | POA: Diagnosis not present

## 2022-10-23 DIAGNOSIS — E7849 Other hyperlipidemia: Secondary | ICD-10-CM | POA: Diagnosis not present

## 2022-10-23 DIAGNOSIS — G500 Trigeminal neuralgia: Secondary | ICD-10-CM | POA: Diagnosis not present

## 2022-10-27 DIAGNOSIS — Z79899 Other long term (current) drug therapy: Secondary | ICD-10-CM | POA: Diagnosis not present

## 2022-10-27 DIAGNOSIS — G936 Cerebral edema: Secondary | ICD-10-CM | POA: Diagnosis not present

## 2022-10-27 DIAGNOSIS — K219 Gastro-esophageal reflux disease without esophagitis: Secondary | ICD-10-CM | POA: Diagnosis not present

## 2022-10-27 DIAGNOSIS — G500 Trigeminal neuralgia: Secondary | ICD-10-CM | POA: Diagnosis not present

## 2022-10-27 DIAGNOSIS — E785 Hyperlipidemia, unspecified: Secondary | ICD-10-CM | POA: Diagnosis not present

## 2022-10-30 ENCOUNTER — Telehealth: Payer: Self-pay

## 2022-10-30 NOTE — Transitions of Care (Post Inpatient/ED Visit) (Signed)
10/30/2022  Name: Madison Esparza MRN: [id number] DOB: 10-27-1970  Today's TOC FU Call Status: Today's TOC FU Call Status:: Successful TOC FU Call Competed TOC FU Call Complete Date: 10/30/22  Transition Care Management Follow-up Telephone Call Date of Discharge: 10/29/22 Discharge Facility: Other (Non-[facility]) Name of Other ([facility]) Discharge Facility: [facility] Type of Discharge: Inpatient Admission Primary Inpatient Discharge Diagnosis:: "trigeminal neuralgia,scheduled craniotomy" How have you been since you were released from the hospital?: Better (Pt states 'it is slowing going but improving." Her headaches have resolved. She is taking pain&muscle relaxer around the clock as ordered to manage pain. Appetite is decreased-she will try some protein shakes.) Any questions or concerns?: No  Items Reviewed: Did you receive and understand the discharge instructions provided?: Yes Medications obtained,verified, and reconciled?: Yes (Medications Reviewed) Any new allergies since your discharge?: No Dietary orders reviewed?: Yes Type of Diet Ordered:: low salt/heart healthy Do you have support at home?: Yes People in Home: spouse Name of Support/Comfort Primary Source: John  Medications Reviewed Today: Medications Reviewed Today     Reviewed by Charlyn Minerva, RN (Registered Nurse) on 10/30/22 at 1141  Med List Status: <None>   Medication Order Taking? Sig Documenting Provider Last Dose Status Informant  carbamazepine (TEGRETOL) 200 MG tablet [id number] Yes Take 0.5 tablets (100 mg total) by mouth in the morning and at bedtime.  Patient taking differently: Take 100 mg by mouth in the morning and at bedtime. Take 0.5-1 tablets (100-200 mg total) by mouth 2 (two) times a day. TAKE 1/2 (ONE-HALF) TABLET BY MOUTH IN THE MORNING AND TAKE 1 TABLET AT BEDTIME. May wean off: 5/23 - 5/29: take 1 tablet at bedtime only. Then stop. If you start having facial pain, return to  your previous dose.   Glendale Chard, DO Taking Active Self  dicyclomine (BENTYL) 20 MG tablet [id number]  Take 1 tablet (20 mg total) by mouth 4 (four) times daily -  before meals and at bedtime. Pt needs office visit Tressia Danas, MD  Active   docusate sodium (COLACE) 100 MG capsule [id number] Yes Take 100 mg by mouth daily. [provider] Taking Active   DULoxetine (CYMBALTA) 30 MG capsule [id number] No Take 1 capsule (30 mg total) by mouth daily.  Patient not taking: Reported on 10/30/2022   Glendale Chard, DO Not Taking Active   fluticasone [facility]) 50 MCG/ACT nasal spray [id number]  Use 1 spray(s) in each nostril once daily Orland Mustard, MD  Active   HYDROcodone-acetaminophen (NORCO/VICODIN) 5-325 MG tablet [id number] Yes Take 2 tablets by mouth every 4 (four) hours as needed for moderate pain. Take 1-2 tablets by mouth every 4 (four) hours as needed (Take 1 for moderate (4-6) or 2 tablets for severe (7-10) postop pain) for up to 5 days. [provider] Taking Active Self  methocarbamol (ROBAXIN) 500 MG tablet [id number] Yes Take 500 mg by mouth every 6 (six) hours as needed for muscle spasms. tablet (500 mg total) by mouth every 6 (six) hours as needed for muscle spasms for up to 10 days. [provider] Taking Active Self  pantoprazole (PROTONIX) 40 MG tablet [id number] Yes Take 1 tablet (40 mg total) by mouth 2 (two) times daily before a meal.  Patient taking differently: Take 40 mg by mouth 2 (two) times daily before a meal. Pt states only taking Lars Masson, MD Taking Active Self  pregabalin (LYRICA) 100 MG capsule [id number] No Take 1 tablet in the AM and  2 tablets at bedtime x 1 week, then reduce to 1 tablet twice daily x 1 week, then 1 tablet at bedtime x 1 week, then stop.  Patient not taking: Reported on 10/30/2022   Glendale Chard, DO Not Taking Active   rosuvastatin (CRESTOR) 10 MG tablet [id number] No Take 1 tablet by mouth once daily   Patient not taking: Reported on 09/09/2022   Dulce Sellar, NP Not Taking Active   traMADol (ULTRAM) 50 MG tablet [id number] No Take 1 tablet (50 mg total) by mouth every 12 (twelve) hours as needed.  Patient not taking: Reported on 10/30/2022   Glendale Chard, DO Not Taking Active             Home Care and Equipment/Supplies: Were Home Health Services Ordered?: NA Any new equipment or medical supplies ordered?: NA  Functional Questionnaire: Do you need assistance with bathing/showering or dressing?: No Do you need assistance with meal preparation?: No Do you need assistance with eating?: No Do you need assistance with getting out of bed/getting out of a chair/moving?: No Do you have difficulty managing or taking your medications?: No  Follow up appointments reviewed: PCP Follow-up appointment confirmed?: Yes Date of PCP follow-up appointment?: 11/17/22 (pt requested appt the second week of June -care guide assisted with making appt) Follow-up Provider: Lanney Gins Follow-up appointment confirmed?: Yes Date of Specialist follow-up appointment?: 11/07/22 Follow-Up Specialty Provider:: Ivory Broad -neurosurgeon, 11/26/2022 10:30 AM Perry [place],MD Do you need transportation to your follow-up appointment?: No (pt confirms spouse will take her to appts) Do you understand care options if your condition(s) worsen?: Yes-patient verbalized understanding  SDOH Interventions Today    Flowsheet Row Most Recent Value  SDOH Interventions   Food Insecurity Interventions Intervention Not Indicated  Transportation Interventions Intervention Not Indicated      TOC Interventions Today    Flowsheet Row Most Recent Value  TOC Interventions   TOC Interventions Discussed/Reviewed TOC Interventions Discussed, Post op wound/incision care, Post discharge activity limitations per provider, S/S of infection, Arranged PCP follow up less than 12 days/Care  Guide scheduled  [per pt request to see provider -second week of June]      Interventions Today    Flowsheet Row Most Recent Value  General Interventions   General Interventions Discussed/Reviewed General Interventions Discussed, Doctor Visits  Doctor Visits Discussed/Reviewed Doctor Visits Discussed, PCP, Specialist  PCP/Specialist Visits Compliance with follow-up visit  Education Interventions   Education Provided Provided Education  Provided Verbal Education On Nutrition, When to see the doctor, Medication, Other  [pain mgmt,bowel regimen]  Nutrition Interventions   Nutrition Discussed/Reviewed Nutrition Discussed, Adding fruits and vegetables, Increasing proteins, Decreasing salt, Decreasing fats  Pharmacy Interventions   Pharmacy Dicussed/Reviewed Pharmacy Topics Discussed, Medications and their functions  Safety Interventions   Safety Discussed/Reviewed Safety Discussed       Alessandra Grout [facility] Care Management Care Management Community Coordinator Direct Phone: [phone] Toll Free: [phone] Fax: [phone]

## 2022-11-07 DIAGNOSIS — Z48811 Encounter for surgical aftercare following surgery on the nervous system: Secondary | ICD-10-CM | POA: Diagnosis not present

## 2022-11-07 DIAGNOSIS — Z9889 Other specified postprocedural states: Secondary | ICD-10-CM | POA: Diagnosis not present

## 2022-11-07 DIAGNOSIS — M5416 Radiculopathy, lumbar region: Secondary | ICD-10-CM | POA: Diagnosis not present

## 2022-11-07 DIAGNOSIS — Z4802 Encounter for removal of sutures: Secondary | ICD-10-CM | POA: Diagnosis not present

## 2022-11-17 ENCOUNTER — Inpatient Hospital Stay: Payer: BC Managed Care – PPO | Admitting: Family

## 2022-11-26 DIAGNOSIS — H4912 Fourth [trochlear] nerve palsy, left eye: Secondary | ICD-10-CM | POA: Diagnosis not present

## 2022-11-26 DIAGNOSIS — Z9889 Other specified postprocedural states: Secondary | ICD-10-CM | POA: Diagnosis not present

## 2022-11-26 DIAGNOSIS — G500 Trigeminal neuralgia: Secondary | ICD-10-CM | POA: Diagnosis not present

## 2022-12-15 ENCOUNTER — Telehealth: Payer: Self-pay | Admitting: Neurology

## 2022-12-15 NOTE — Telephone Encounter (Signed)
Called patient and informed her that no need to f/u with Dr. Allena Katz. She can follow up as needed. Patient thanked [place] for the call.

## 2022-12-15 NOTE — Telephone Encounter (Signed)
No need for follow-up with me

## 2022-12-15 NOTE — Telephone Encounter (Signed)
Patient has an appt tomorrow but canx it. She is seeing a neurosurgeon now and has had surgery. Wants to know if she needs a f/u with Dr.Patel

## 2022-12-16 ENCOUNTER — Ambulatory Visit: Payer: BC Managed Care – PPO | Admitting: Neurology

## 2023-01-17 IMAGING — MG MM DIGITAL SCREENING BILAT W/ TOMO AND CAD
6 of 12 series · 6 of 36 positions shown · non-contrast
Comparison: Previous exam(s).

CLINICAL DATA: Screening.

EXAM:
DIGITAL SCREENING BILATERAL MAMMOGRAM WITH TOMOSYNTHESIS AND CAD
TECHNIQUE: Bilateral screening digital craniocaudal and mediolateral oblique
mammograms were obtained. Bilateral screening digital breast
tomosynthesis was performed. The images were evaluated with
computer-aided detection.

[L MLO synth-2D]
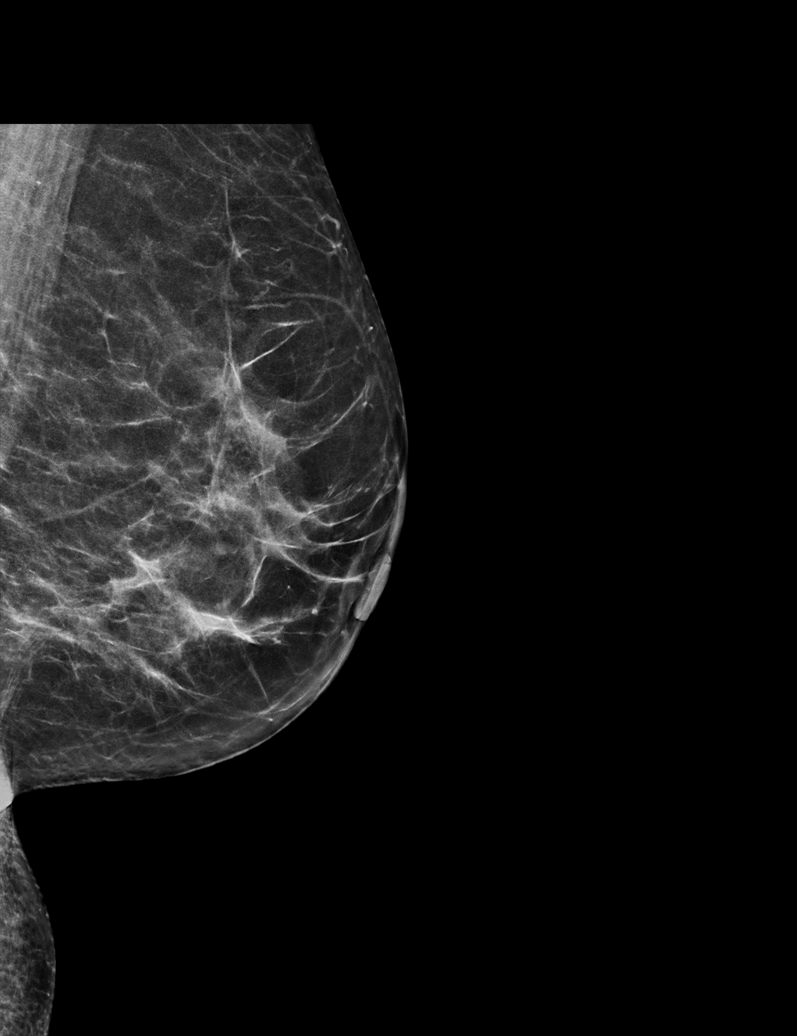

[L CC synth-2D (1 of 2)]
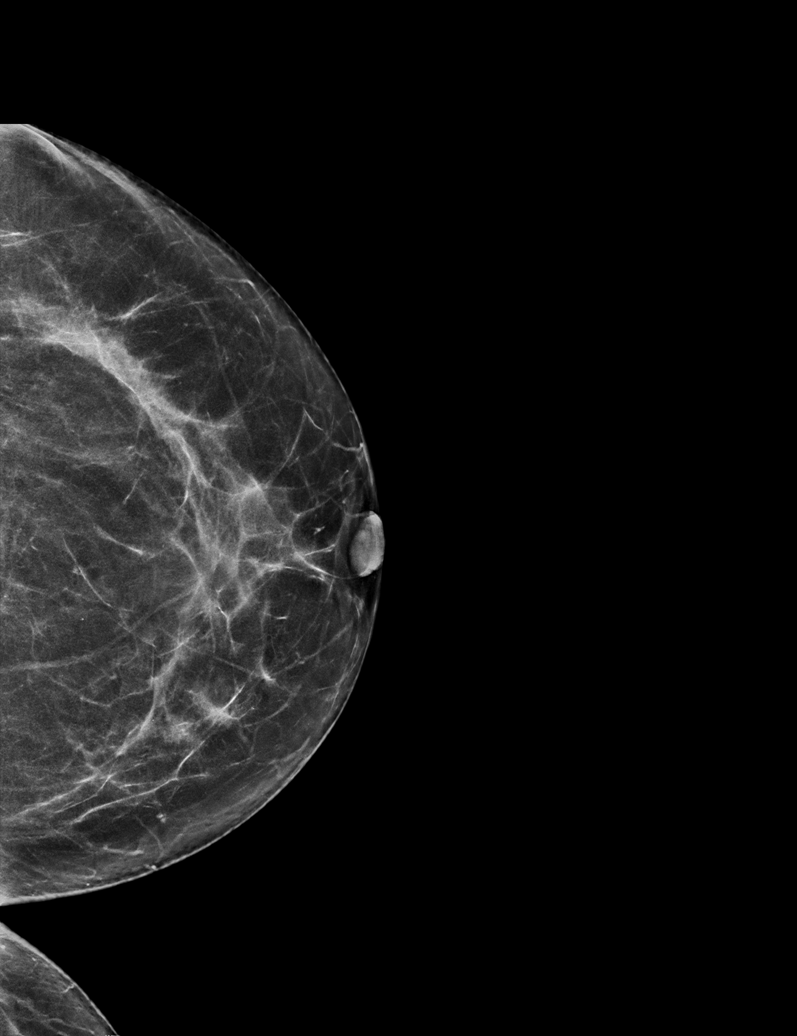

[R CC synth-2D (1 of 2)]
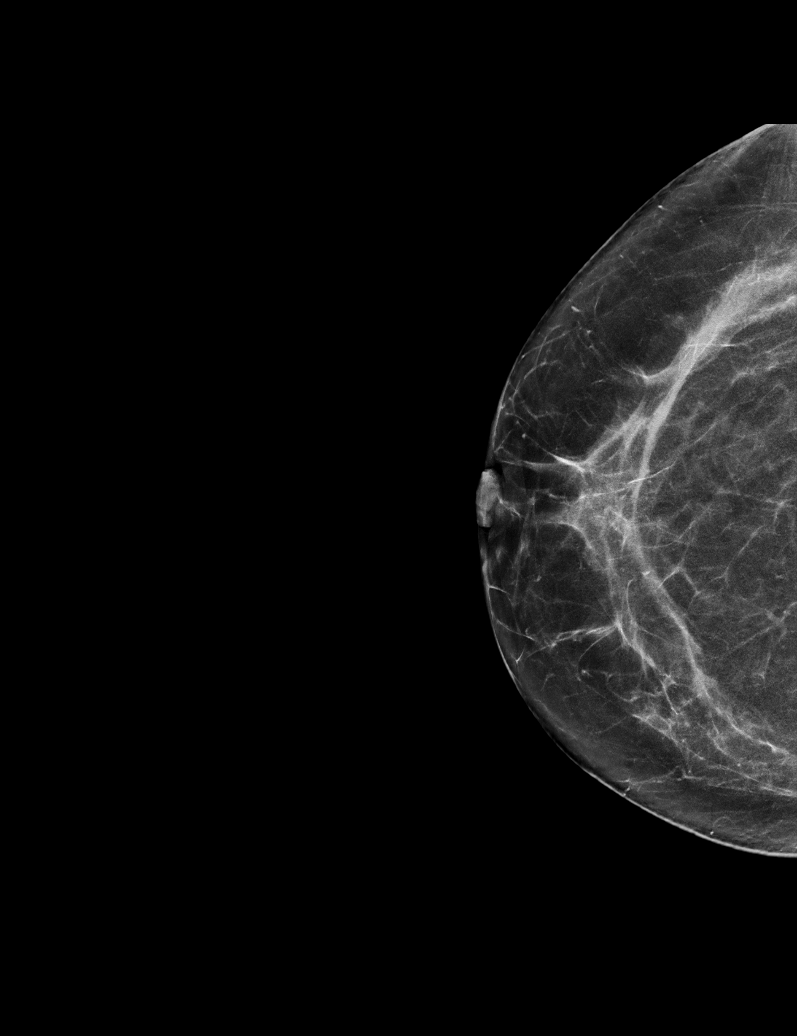

[L CC synth-2D (2 of 2)]
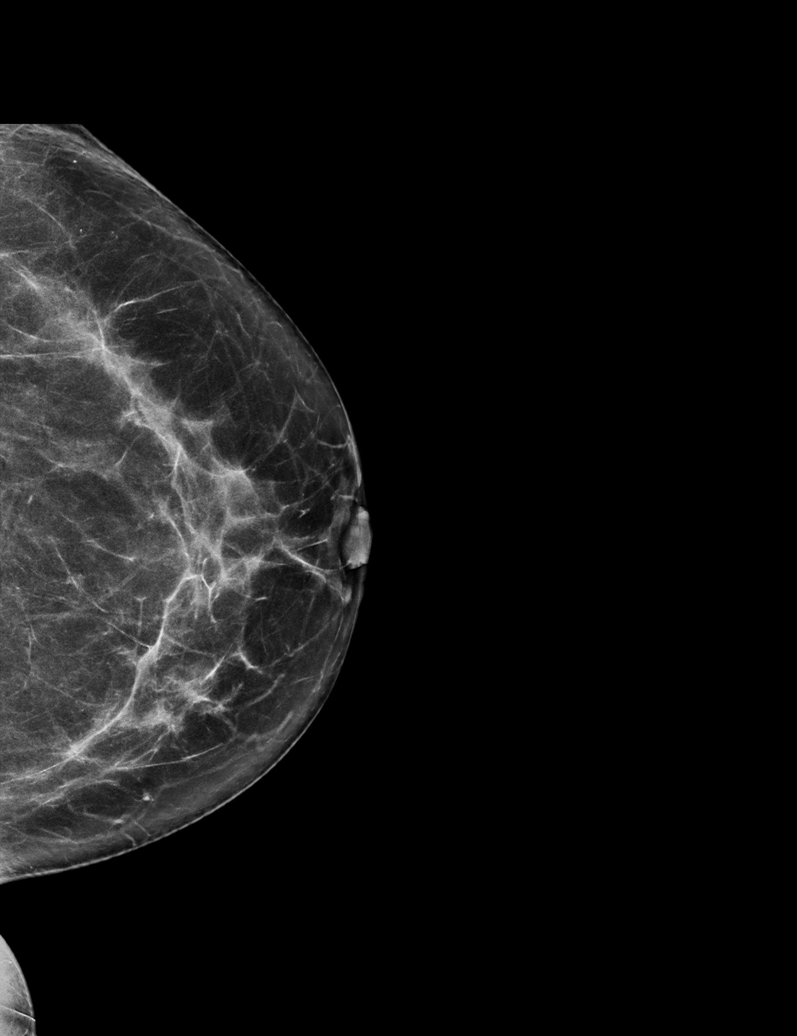

[R MLO synth-2D]
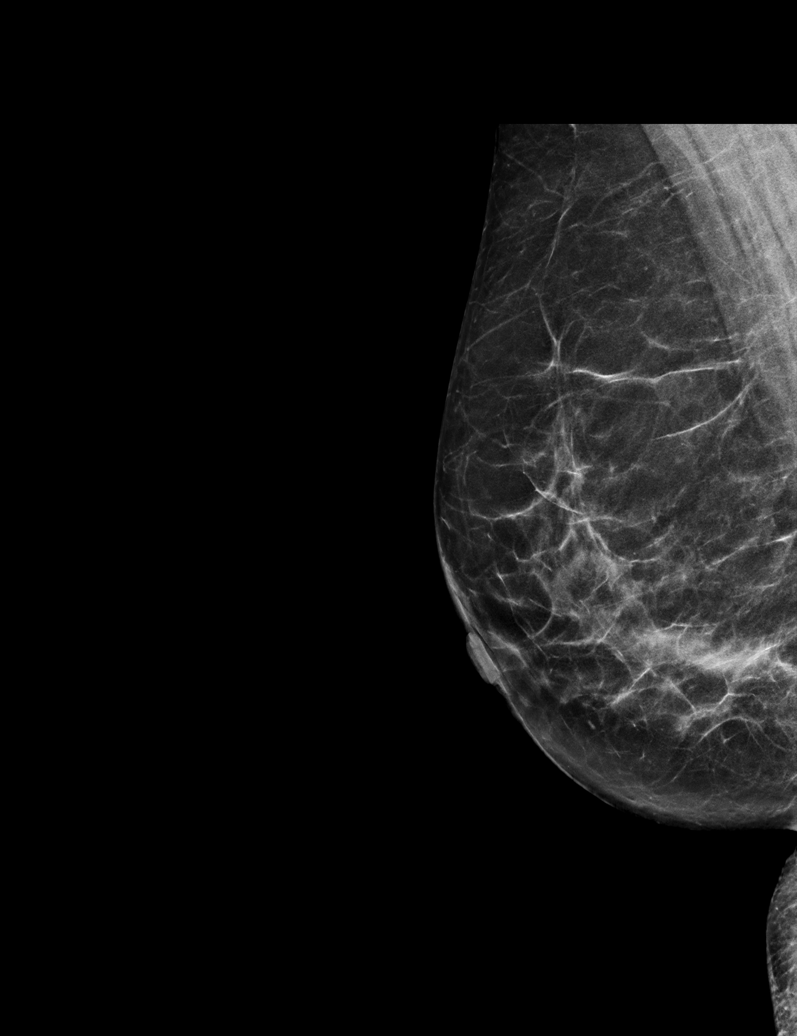

[R CC synth-2D (2 of 2)]
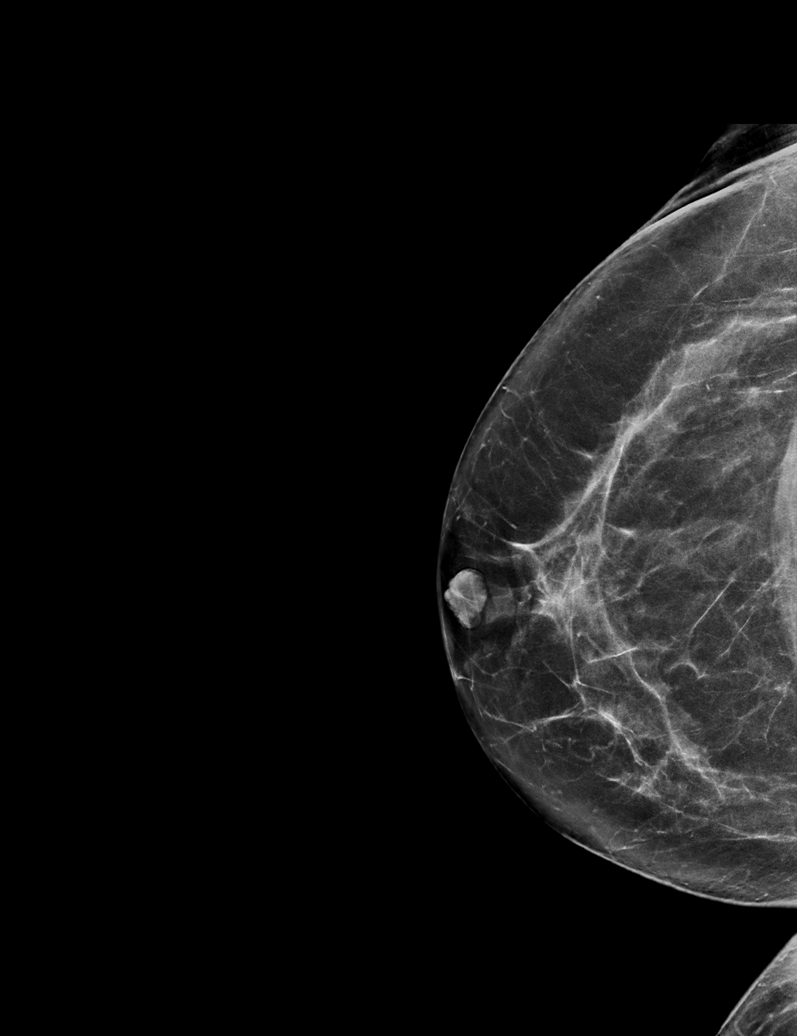

[6 of 36 positions shown; findings below may reference images not displayed]

ACR Breast Density Category b: There are scattered areas of
fibroglandular density.
FINDINGS: There are no findings suspicious for malignancy.
IMPRESSION: No mammographic evidence of malignancy. A result letter of this
screening mammogram will be mailed directly to the patient.

RECOMMENDATION:
Screening mammogram in one year. (Code:51-O-LD2)

BI-RADS CATEGORY  1: Negative.

## 2023-02-17 ENCOUNTER — Telehealth: Payer: Self-pay | Admitting: Family

## 2023-02-17 ENCOUNTER — Other Ambulatory Visit: Payer: Self-pay | Admitting: Obstetrics and Gynecology

## 2023-02-17 DIAGNOSIS — Z1231 Encounter for screening mammogram for malignant neoplasm of breast: Secondary | ICD-10-CM

## 2023-02-17 NOTE — Telephone Encounter (Signed)
fine with me, thx.

## 2023-02-17 NOTE — Telephone Encounter (Signed)
Patient requests TOC from [facility] to Dr. Mardelle Matte.  Please advise.

## 2023-02-18 ENCOUNTER — Ambulatory Visit
Admission: RE | Admit: 2023-02-18 | Discharge: 2023-02-18 | Disposition: A | Discharge status: Home / Self Care | Payer: BC Managed Care – PPO | Source: Ambulatory Visit | Attending: Obstetrics and Gynecology | Admitting: Obstetrics and Gynecology

## 2023-02-18 DIAGNOSIS — Z1231 Encounter for screening mammogram for malignant neoplasm of breast: Secondary | ICD-10-CM | POA: Diagnosis not present

## 2023-02-19 NOTE — Telephone Encounter (Signed)
LMTCB to schedule TOC from [facility] to Dr. Mardelle Matte

## 2023-05-19 ENCOUNTER — Other Ambulatory Visit (HOSPITAL_COMMUNITY)
Admission: RE | Admit: 2023-05-19 | Discharge: 2023-05-19 | Disposition: A | Discharge status: Home / Self Care | Payer: BC Managed Care – PPO | Source: Ambulatory Visit | Attending: Obstetrics and Gynecology | Admitting: Obstetrics and Gynecology

## 2023-05-19 DIAGNOSIS — Z01419 Encounter for gynecological examination (general) (routine) without abnormal findings: Secondary | ICD-10-CM | POA: Diagnosis not present

## 2023-05-19 DIAGNOSIS — R6882 Decreased libido: Secondary | ICD-10-CM | POA: Diagnosis not present

## 2023-05-21 LAB — CYTOLOGY - PAP
Comment: NEGATIVE
Diagnosis: NEGATIVE
High risk HPV: NEGATIVE

## 2023-08-11 DIAGNOSIS — K589 Irritable bowel syndrome without diarrhea: Secondary | ICD-10-CM | POA: Diagnosis not present

## 2023-08-11 DIAGNOSIS — Z01818 Encounter for other preprocedural examination: Secondary | ICD-10-CM | POA: Diagnosis not present

## 2023-08-11 DIAGNOSIS — K219 Gastro-esophageal reflux disease without esophagitis: Secondary | ICD-10-CM | POA: Diagnosis not present

## 2023-08-12 ENCOUNTER — Ambulatory Visit: Payer: BC Managed Care – PPO | Admitting: Family Medicine

## 2023-08-12 ENCOUNTER — Encounter: Payer: Self-pay | Admitting: Family Medicine

## 2023-08-12 VITALS — BP 137/85 | HR 76 | Temp 97.7°F | Ht 61.0 in | Wt 145.6 lb

## 2023-08-12 DIAGNOSIS — E782 Mixed hyperlipidemia: Secondary | ICD-10-CM

## 2023-08-12 DIAGNOSIS — D509 Iron deficiency anemia, unspecified: Secondary | ICD-10-CM | POA: Diagnosis not present

## 2023-08-12 DIAGNOSIS — L300 Nummular dermatitis: Secondary | ICD-10-CM

## 2023-08-12 DIAGNOSIS — Z8249 Family history of ischemic heart disease and other diseases of the circulatory system: Secondary | ICD-10-CM

## 2023-08-12 DIAGNOSIS — F4321 Adjustment disorder with depressed mood: Secondary | ICD-10-CM

## 2023-08-12 DIAGNOSIS — K589 Irritable bowel syndrome without diarrhea: Secondary | ICD-10-CM

## 2023-08-12 DIAGNOSIS — Z9889 Other specified postprocedural states: Secondary | ICD-10-CM

## 2023-08-12 DIAGNOSIS — G500 Trigeminal neuralgia: Secondary | ICD-10-CM

## 2023-08-12 LAB — LIPID PANEL
Cholesterol: 268 mg/dL — ABNORMAL HIGH (ref 0–200)
HDL: 106.7 mg/dL (ref >39.00)
LDL Cholesterol: 146 mg/dL — ABNORMAL HIGH (ref 0–99)
NonHDL: 161.51
Total CHOL/HDL Ratio: 3
Triglycerides: 76 mg/dL (ref 0.0–149.0)
VLDL: 15.2 mg/dL (ref 0.0–40.0)

## 2023-08-12 LAB — COMPREHENSIVE METABOLIC PANEL
ALT: 26 U/L (ref 0–35)
AST: 34 U/L (ref 0–37)
Albumin: 4.9 g/dL (ref 3.5–5.2)
Alkaline Phosphatase: 71 U/L (ref 39–117)
BUN: 12 mg/dL (ref 6–23)
CO2: 29 meq/L (ref 19–32)
Calcium: 10.3 mg/dL (ref 8.4–10.5)
Chloride: 101 meq/L (ref 96–112)
Creatinine, Ser: 0.64 mg/dL (ref 0.40–1.20)
GFR: 102.45 mL/min (ref >60.00)
Glucose, Bld: 91 mg/dL (ref 70–99)
Potassium: 4.8 meq/L (ref 3.5–5.1)
Sodium: 139 meq/L (ref 135–145)
Total Bilirubin: 0.6 mg/dL (ref 0.2–1.2)
Total Protein: 8 g/dL (ref 6.0–8.3)

## 2023-08-12 LAB — TSH: TSH: 1.21 u[IU]/mL (ref 0.35–5.50)
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
## 2023-08-13 LAB — CBC WITH DIFFERENTIAL/PLATELET
Basophils Absolute: 0 10*3/uL (ref 0.0–0.1)
Basophils Relative: 0.4 % (ref 0.0–3.0)
Eosinophils Absolute: 0 10*3/uL (ref 0.0–0.7)
Eosinophils Relative: 0.6 % (ref 0.0–5.0)
HCT: 36.3 % (ref 36.0–46.0)
Hemoglobin: 11.8 g/dL — ABNORMAL LOW (ref 12.0–15.0)
Lymphocytes Relative: 29 % (ref 12.0–46.0)
Lymphs Abs: 1.9 10*3/uL (ref 0.7–4.0)
MCHC: 32.5 g/dL (ref 30.0–36.0)
MCV: 74.9 fl — ABNORMAL LOW (ref 78.0–100.0)
Monocytes Absolute: 0.4 10*3/uL (ref 0.1–1.0)
Monocytes Relative: 6.6 % (ref 3.0–12.0)
Neutro Abs: 4.1 10*3/uL (ref 1.4–7.7)
Neutrophils Relative %: 63.4 % (ref 43.0–77.0)
Platelets: 304 10*3/uL (ref 150.0–400.0)
RBC: 4.84 Mil/uL (ref 3.87–5.11)
RDW: 15.4 % (ref 11.5–15.5)
WBC: 6.5 10*3/uL (ref 4.0–10.5)

## 2023-08-17 ENCOUNTER — Encounter: Payer: Self-pay | Admitting: Family Medicine

## 2023-08-17 NOTE — Progress Notes (Signed)
 Labs reviewed.  The ASCVD Risk score (Arnett DK, et al., 2019) failed to calculate for the following reasons:   The valid HDL cholesterol range is 20 to 100 mg/dL  Dear Ms. Madison Esparza, Thank you for allowing me to care for you at your recent office visit.  I wanted to let you know that I have reviewed your lab test results and am happy to report that they are all stable.   Your cholesterol is high however, your HDL or "happy" cholesterol is excellet at 106.7. This is heart protective. At this time we can hold off on restarting a statin, but when you return we can discuss in detail further.  I hope you and your family are doing ok.  Sincerely, Dr. Mardelle Matte

## 2023-08-20 ENCOUNTER — Other Ambulatory Visit (HOSPITAL_BASED_OUTPATIENT_CLINIC_OR_DEPARTMENT_OTHER): Payer: Self-pay

## 2023-08-20 MED ORDER — TRIAMCINOLONE ACETONIDE 0.1 % EX CREA
1.0000 | TOPICAL_CREAM | Freq: Two times a day (BID) | CUTANEOUS | 0 refills | Status: AC
  Filled 2023-08-20: qty 45, 30d supply, fill #0

## 2023-09-23 ENCOUNTER — Telehealth: Payer: Self-pay

## 2023-09-23 ENCOUNTER — Other Ambulatory Visit: Payer: Self-pay | Admitting: Family

## 2023-09-23 MED ORDER — DICYCLOMINE HCL 20 MG PO TABS
20.0000 mg | ORAL_TABLET | Freq: Three times a day (TID) | ORAL | 3 refills | Status: AC
  Filled 2023-09-23: qty 360, 90d supply, fill #0
  Filled 2024-03-09: qty 360, 90d supply, fill #1

## 2023-09-23 NOTE — Telephone Encounter (Signed)
 Copied from CRM [id number]. Topic: Clinical - Medication Question >> Sep 23, 2023  8:45 AM Albertha Alosa wrote: Reason for CRM: Patient called in wanting to know if Dr.Andy could fill her  dicyclomine (BENTYL) 20 MG tablet , or if she would need to go back and see her gastrologist.   [phone]  Please Advise.  Message has been sent to [place] to address

## 2023-09-24 ENCOUNTER — Other Ambulatory Visit (HOSPITAL_BASED_OUTPATIENT_CLINIC_OR_DEPARTMENT_OTHER): Payer: Self-pay

## 2023-11-10 ENCOUNTER — Encounter: Payer: Self-pay | Admitting: Dermatology

## 2023-11-10 ENCOUNTER — Ambulatory Visit: Payer: BC Managed Care – PPO | Admitting: Dermatology

## 2023-11-10 VITALS — BP 147/100

## 2023-11-10 DIAGNOSIS — L814 Other melanin hyperpigmentation: Secondary | ICD-10-CM

## 2023-11-10 DIAGNOSIS — D1801 Hemangioma of skin and subcutaneous tissue: Secondary | ICD-10-CM | POA: Diagnosis not present

## 2023-11-10 DIAGNOSIS — Z1283 Encounter for screening for malignant neoplasm of skin: Secondary | ICD-10-CM

## 2023-11-10 DIAGNOSIS — D2272 Melanocytic nevi of left lower limb, including hip: Secondary | ICD-10-CM | POA: Diagnosis not present

## 2023-11-10 DIAGNOSIS — L820 Inflamed seborrheic keratosis: Secondary | ICD-10-CM | POA: Diagnosis not present

## 2023-11-10 DIAGNOSIS — W908XXA Exposure to other nonionizing radiation, initial encounter: Secondary | ICD-10-CM

## 2023-11-10 DIAGNOSIS — D229 Melanocytic nevi, unspecified: Secondary | ICD-10-CM

## 2023-11-10 DIAGNOSIS — L738 Other specified follicular disorders: Secondary | ICD-10-CM

## 2023-11-10 DIAGNOSIS — D225 Melanocytic nevi of trunk: Secondary | ICD-10-CM

## 2023-11-10 DIAGNOSIS — D485 Neoplasm of uncertain behavior of skin: Secondary | ICD-10-CM

## 2023-11-10 DIAGNOSIS — D492 Neoplasm of unspecified behavior of bone, soft tissue, and skin: Secondary | ICD-10-CM

## 2023-11-10 DIAGNOSIS — L821 Other seborrheic keratosis: Secondary | ICD-10-CM

## 2023-11-10 DIAGNOSIS — L578 Other skin changes due to chronic exposure to nonionizing radiation: Secondary | ICD-10-CM

## 2023-11-10 NOTE — Patient Instructions (Addendum)

## 2023-11-10 NOTE — Progress Notes (Signed)
 New Patient Visit   Subjective  Madison Esparza is a 53 y.o. female who presents for the following:  Total Body Skin Exam (TBSE)  Patient present today for new patient visit for TBSE. The patient reports she has spots, moles and lesions to be evaluated, some may be new or changing and the patient may have concern these could be cancer. Patient has previously been treated by a dermatologist. Patient reports she has hx of bx (pt can't recall if it was [facility] or [facility]). Patient denies family history of skin cancers. Patient reports throughout her lifetime has had complete sun exposure. Currently, patient reports if she has excessive sun exposure, she does apply sunscreen and/or wears protective coverings when she is going to be outside for prolonged times.  The following portions of the chart were reviewed this encounter and updated as appropriate: medications, allergies, medical history  Review of Systems:  No other skin or systemic complaints except as noted in HPI or Assessment and Plan.  Objective  Well appearing patient in no apparent distress; mood and affect are within normal limits.  A full examination was performed including scalp, head, eyes, ears, nose, lips, neck, chest, axillae, abdomen, back, buttocks, bilateral upper extremities, bilateral lower extremities, hands, feet, fingers, toes, fingernails, and toenails. All findings within normal limits unless otherwise noted below.   Relevant exam findings are noted in the Assessment and Plan.  Right Breast 6.22mm irregular brown macule Left Breast 6.34mm irregular brown macule Left Thigh - Posterior 11mmx5mm irregular brown macule  Assessment & Plan   LENTIGINES, SEBORRHEIC KERATOSES, HEMANGIOMAS - Benign normal skin lesions - Benign-appearing - Call for any changes  MELANOCYTIC NEVI - Tan-brown and/or pink-flesh-colored symmetric macules and papules - Benign appearing on exam today - Observation - Call clinic for new or changing  moles - Recommend daily use of broad spectrum spf 30+ sunscreen to sun-exposed areas.   ACTINIC DAMAGE - Chronic condition, secondary to cumulative UV/sun exposure - diffuse scaly erythematous macules with underlying dyspigmentation - Recommend daily broad spectrum sunscreen SPF 30+ to sun-exposed areas, reapply every 2 hours as needed.  - Staying in the shade or wearing long sleeves, sun glasses (UVA+UVB protection) and wide brim hats (4-inch brim around the entire circumference of the hat) are also recommended for sun protection.  - Call for new or changing lesions.  Sebaceous Hyperplasia - Small yellow papules with a central dell scattered across forehead - Benign-appearing - Observe. Call for changes.   SKIN CANCER SCREENING PERFORMED TODAY INFLAMED SEBORRHEIC KERATOSIS Right Buccal Cheek Destruction of lesion - Right Buccal Cheek Complexity: simple   Destruction method: cryotherapy   Informed consent: discussed and consent obtained   Timeout:  patient name, date of birth, surgical site, and procedure verified Lesion destroyed using liquid nitrogen: Yes   Post-procedure details: wound care instructions given   NEOPLASM OF UNCERTAIN BEHAVIOR OF SKIN (3) Right Breast Skin / nail biopsy Type of biopsy: tangential   Informed consent: discussed and consent obtained   Timeout: patient name, date of birth, surgical site, and procedure verified   Procedure prep:  Patient was prepped and draped in usual sterile fashion Prep type:  Isopropyl alcohol Anesthesia: the lesion was anesthetized in a standard fashion   Anesthetic:  1% lidocaine  w/ epinephrine 1-100,000 buffered w/ 8.4% NaHCO3 Instrument used: DermaBlade   Hemostasis achieved with: aluminum chloride   Outcome: patient tolerated procedure well   Post-procedure details: sterile dressing applied and wound care instructions given  Dressing type: petrolatum gauze and bandage   Left Breast Skin / nail biopsy Type of biopsy:  tangential   Informed consent: discussed and consent obtained   Timeout: patient name, date of birth, surgical site, and procedure verified   Procedure prep:  Patient was prepped and draped in usual sterile fashion Prep type:  Isopropyl alcohol Anesthesia: the lesion was anesthetized in a standard fashion   Anesthetic:  1% lidocaine  w/ epinephrine 1-100,000 buffered w/ 8.4% NaHCO3 Instrument used: DermaBlade   Hemostasis achieved with: aluminum chloride   Outcome: patient tolerated procedure well   Post-procedure details: sterile dressing applied and wound care instructions given   Dressing type: petrolatum gauze and bandage   Left Thigh - Posterior Skin / nail biopsy Type of biopsy: tangential   Informed consent: discussed and consent obtained   Timeout: patient name, date of birth, surgical site, and procedure verified   Procedure prep:  Patient was prepped and draped in usual sterile fashion Prep type:  Isopropyl alcohol Anesthesia: the lesion was anesthetized in a standard fashion   Anesthetic:  1% lidocaine  w/ epinephrine 1-100,000 buffered w/ 8.4% NaHCO3 Instrument used: DermaBlade   Hemostasis achieved with: aluminum chloride   Outcome: patient tolerated procedure well   Post-procedure details: sterile dressing applied and wound care instructions given   Dressing type: petrolatum gauze and bandage    Return in about 1 year (around 11/09/2024) for TBSE.   Documentation: I have reviewed the above documentation for accuracy and completeness, and I agree with the above.   I, Shirron Louanne Roussel, CMA, am acting as scribe for [employer], DO.   Louana Roup, DO

## 2023-11-11 DIAGNOSIS — R6882 Decreased libido: Secondary | ICD-10-CM | POA: Diagnosis not present

## 2023-11-12 LAB — SURGICAL PATHOLOGY

## 2023-11-17 ENCOUNTER — Ambulatory Visit: Payer: Self-pay | Admitting: Dermatology

## 2023-11-18 DIAGNOSIS — R6882 Decreased libido: Secondary | ICD-10-CM | POA: Diagnosis not present

## 2024-01-27 DIAGNOSIS — Z1211 Encounter for screening for malignant neoplasm of colon: Secondary | ICD-10-CM | POA: Diagnosis not present

## 2024-01-27 DIAGNOSIS — K5730 Diverticulosis of large intestine without perforation or abscess without bleeding: Secondary | ICD-10-CM | POA: Diagnosis not present

## 2024-01-27 DIAGNOSIS — K648 Other hemorrhoids: Secondary | ICD-10-CM | POA: Diagnosis not present

## 2024-01-27 LAB — HM COLONOSCOPY

## 2024-02-10 DIAGNOSIS — R6882 Decreased libido: Secondary | ICD-10-CM | POA: Diagnosis not present

## 2024-02-11 ENCOUNTER — Ambulatory Visit: Admitting: Family Medicine

## 2024-02-11 ENCOUNTER — Other Ambulatory Visit (HOSPITAL_BASED_OUTPATIENT_CLINIC_OR_DEPARTMENT_OTHER): Payer: Self-pay

## 2024-02-11 VITALS — BP 134/86 | HR 77 | Temp 98.2°F | Ht 61.0 in | Wt 151.0 lb

## 2024-02-11 DIAGNOSIS — E663 Overweight: Secondary | ICD-10-CM

## 2024-02-11 DIAGNOSIS — F5104 Psychophysiologic insomnia: Secondary | ICD-10-CM | POA: Diagnosis not present

## 2024-02-11 DIAGNOSIS — J301 Allergic rhinitis due to pollen: Secondary | ICD-10-CM

## 2024-02-11 DIAGNOSIS — F411 Generalized anxiety disorder: Secondary | ICD-10-CM | POA: Insufficient documentation

## 2024-02-11 MED ORDER — PANTOPRAZOLE SODIUM 40 MG PO TBEC: 40.0000 mg | DELAYED_RELEASE_TABLET | Freq: Every day | ORAL | Status: AC

## 2024-02-11 MED ORDER — ALPRAZOLAM 0.5 MG PO TABS
0.5000 mg | ORAL_TABLET | Freq: Every evening | ORAL | 0 refills | Status: AC | PRN
  Filled 2024-02-11: qty 20, 20d supply, fill #0

## 2024-02-11 MED ORDER — FLUTICASONE PROPIONATE 50 MCG/ACT NA SUSP
1.0000 | Freq: Every day | NASAL | 5 refills | Status: AC
  Filled 2024-02-11: qty 16, 30d supply, fill #0
  Filled 2024-05-16: qty 16, 30d supply, fill #1

## 2024-02-11 MED ORDER — PAROXETINE HCL 20 MG PO TABS
ORAL_TABLET | ORAL | 3 refills | Status: DC
  Filled 2024-02-11: qty 90, 90d supply, fill #0

## 2024-02-11 NOTE — Progress Notes (Signed)
 Subjective  CC:  Chief Complaint  Patient presents with   Anxiety    HPI: Madison Esparza is a 53 y.o. female who presents to the office today to address the problems listed above in the chief complaint. Discussed the use of AI scribe software for clinical note transcription with the patient, who gave verbal consent to proceed.  History of Present Illness Madison Esparza is a 53 year old female who presents with anxiety and sleep disturbances.  She has been experiencing significant anxiety and sleep disturbances primarily due to family stressors. Her anxiety has increased since her sister's death in Sep 10, 2023, leading to familial tensions, particularly with her brother who took over the executorship of her sister's will. This has caused a rift in the family, affecting her relationship with her brother and his wife, with whom she was previously close.  She describes waking up between 2:30 and 4:00 AM and being unable to return to sleep due to racing thoughts. She attributes some of her anxiety to the cessation of alcohol consumption as part of a new diet started on Tuesday. She and her husband have been using alcohol to manage stress but have recently stopped, which she believes may be contributing to her anxiety.  She experiences physical symptoms of anxiety, including stomach upset and irritability, particularly in the mornings. She has attempted to manage her anxiety with a half Xanax  left by her sister, which she finds effective but is hesitant to use regularly. She has a history of taking Lexapro and Cymbalta  for anxiety but discontinued them due to side effects, including increased blood pressure with Cymbalta .  Overweight: has gained 10 pounds in last year: now on diet: weight watchers and cut out alcohol.    Assessment  1. GAD (generalized anxiety disorder)   2. Psychophysiological insomnia   3. Overweight (BMI 25.0-29.9)   4. Seasonal allergic rhinitis due to pollen      Plan   Assessment and Plan Assessment & Plan Generalized anxiety disorder with insomnia Chronic anxiety worsened by family stressors, causing insomnia and physical symptoms. Lexapro not tolerated. Concerns about daily medication due to past adverse effects with Cymbalta . Chose Paxil  for anxiety and sleep aid. Xanax  for acute anxiety and sleep issues. - Prescribe Paxil  (fluoxetine) for daily management of anxiety and potential sleep aid. - Prescribe Xanax  for intermittent use to manage acute anxiety and sleep disturbances. - Reassess in 6-8 weeks for treatment effectiveness and tolerability.  Overweight Weight gain of 10 pounds over the past year due to stress and lifestyle. Currently on a diet. Discussed stress and cortisol impact on weight.  Allergic rhinitis Previously managed with Flonase . Current over-the-counter Flonase  is more expensive than prescription. - Prescribe Flonase  to manage allergic rhinitis symptoms.    Follow up: 6-8 weeks to recheck anxiety and sleep No orders of the defined types were placed in this encounter.  Meds ordered this encounter  Medications   PARoxetine  (PAXIL ) 20 MG tablet    Sig: Take 0.5 tablets (10 mg total) by mouth at bedtime for 7 days, THEN 1 tablet (20 mg total) at bedtime.    Dispense:  90 tablet    Refill:  3   fluticasone  (FLONASE ) 50 MCG/ACT nasal spray    Sig: Place 1 spray into both nostrils daily.    Dispense:  16 g    Refill:  5   ALPRAZolam  (XANAX ) 0.5 MG tablet    Sig: Take 1 tablet (0.5 mg total) by mouth at bedtime as  needed for anxiety or sleep.    Dispense:  20 tablet    Refill:  0     I reviewed the patients updated PMH, FH, and SocHx.  Patient Active Problem List   Diagnosis Date Noted   Left-sided trigeminal neuralgia 08/12/2021    Priority: High   Mixed hyperlipidemia 06/30/2018    Priority: High   Microcytic anemia 07/05/2018    Priority: Medium    Allergic rhinitis due to pollen 07/25/2020    Priority: Low    Vitamin D deficiency 07/25/2020    Priority: Low   Hiatal hernia 08/10/2019    Priority: Low   GAD (generalized anxiety disorder) 02/11/2024   Family history of premature CAD 08/12/2023   Nummular eczema 08/12/2023   S/P craniotomy 11/07/2022   Current Meds  Medication Sig   ALPRAZolam  (XANAX ) 0.5 MG tablet Take 1 tablet (0.5 mg total) by mouth at bedtime as needed for anxiety or sleep.   dicyclomine  (BENTYL ) 20 MG tablet Take 1 tablet (20 mg total) by mouth 4 (four) times daily -  before meals and at bedtime.   docusate sodium (COLACE) 100 MG capsule Take 100 mg by mouth daily.   pantoprazole  (PROTONIX ) 40 MG tablet Take 1 tablet (40 mg total) by mouth 2 (two) times daily before a meal. (Patient taking differently: Take 40 mg by mouth 2 (two) times daily before a meal. Pt states only taking QAM)   PARoxetine  (PAXIL ) 20 MG tablet Take 0.5 tablets (10 mg total) by mouth at bedtime for 7 days, THEN 1 tablet (20 mg total) at bedtime.   testosterone  cypionate (DEPO-TESTOSTERONE ) 100 MG/ML injection 1 mL Intramuscular once; Duration: 90 days   triamcinolone  cream (KENALOG ) 0.1 % Apply 1 Application topically 2 (two) times daily. For 2 weeks, then as needed   [DISCONTINUED] fluticasone  (FLONASE ) 50 MCG/ACT nasal spray Use 1 spray(s) in each nostril once daily   Allergies: Patient is allergic to motrin [ibuprofen]. Family History: Patient family history includes Arthritis in her maternal grandmother, mother, and sister; Breast cancer in her cousin; Hearing loss in her maternal grandfather and maternal grandmother; Heart attack in her sister; Heart disease in her brother; Heart disease (age of onset: 63) in her sister; Hyperlipidemia in her mother; Hypertension in her maternal grandmother; Miscarriages / [place] in her mother; Stroke in her sister. Social History:  Patient  reports that she has quit smoking. Her smoking use included cigarettes. She has never used smokeless tobacco. She reports  current alcohol use of about 7.0 standard drinks of alcohol per week. She reports that she does not use drugs.  Review of Systems: Constitutional: Negative for fever malaise or anorexia Cardiovascular: negative for chest pain Respiratory: negative for SOB or persistent cough Gastrointestinal: negative for abdominal pain  Objective  Vitals: BP 134/86   Pulse 77   Temp 98.2 F (36.8 C)   Ht 5' 1 (1.549 m)   Wt 151 lb (68.5 kg)   SpO2 100%   BMI 28.53 kg/m  General: no acute distress , A&Ox3, appears well Psych: nl mood and affect. Nl speech Commons side effects, risks, benefits, and alternatives for medications and treatment plan prescribed today were discussed, and the patient expressed understanding of the given instructions. Patient is instructed to call or message via MyChart if he/she has any questions or concerns regarding our treatment plan. No barriers to understanding were identified. We discussed Red Flag symptoms and signs in detail. Patient expressed understanding regarding what to do in case of  urgent or emergency type symptoms.  Medication list was reconciled, printed and provided to the patient in AVS. Patient instructions and summary information was reviewed with the patient as documented in the AVS. This note was prepared with assistance of Dragon voice recognition software. Occasional wrong-word or sound-a-like substitutions may have occurred due to the inherent limitations of voice recognition software

## 2024-02-11 NOTE — Patient Instructions (Addendum)
 Please f/u 6-8 weeks to recheck mood and sleep  If you have any questions or concerns, please don't hesitate to send me a message via MyChart or call the office at [phone]. Thank you for visiting with us  today! It's our pleasure caring for you.    VISIT SUMMARY: During your visit, we discussed your anxiety and sleep disturbances, which have been significantly impacted by family stressors and recent lifestyle changes. We also addressed your concerns about weight gain and allergic rhinitis.  YOUR PLAN: -GENERALIZED ANXIETY DISORDER WITH INSOMNIA: Generalized anxiety disorder is a condition characterized by excessive worry and physical symptoms like stomach upset and irritability. We have prescribed Paxil  for daily management of your anxiety and as a potential sleep aid, and Xanax  for occasional use to manage acute anxiety and sleep disturbances. We will reassess the effectiveness and tolerability of these medications in 6-8 weeks.  -OVERWEIGHT: Your weight gain over the past year is likely due to stress and lifestyle factors. We discussed how stress and cortisol can impact weight, and you are currently managing this through diet.  -ALLERGIC RHINITIS: Allergic rhinitis is an allergic reaction that causes sneezing, congestion, and runny nose. We have prescribed Flonase  to help manage your symptoms, as it is more cost-effective than the over-the-counter version you were using.  INSTRUCTIONS: Please follow up in 6-8 weeks to reassess the effectiveness and tolerability of your new medications for anxiety and sleep disturbances.                      Contains text generated by Abridge.                                 Contains text generated by Abridge.

## 2024-02-12 DIAGNOSIS — R6882 Decreased libido: Secondary | ICD-10-CM | POA: Diagnosis not present

## 2024-02-22 ENCOUNTER — Ambulatory Visit: Admitting: Family Medicine

## 2024-02-27 ENCOUNTER — Encounter: Payer: Self-pay | Admitting: Family Medicine

## 2024-02-27 DIAGNOSIS — K5790 Diverticulosis of intestine, part unspecified, without perforation or abscess without bleeding: Secondary | ICD-10-CM | POA: Insufficient documentation

## 2024-03-16 ENCOUNTER — Other Ambulatory Visit (HOSPITAL_BASED_OUTPATIENT_CLINIC_OR_DEPARTMENT_OTHER): Payer: Self-pay

## 2024-03-24 ENCOUNTER — Ambulatory Visit (INDEPENDENT_AMBULATORY_CARE_PROVIDER_SITE_OTHER): Admitting: Family Medicine

## 2024-03-24 ENCOUNTER — Encounter: Payer: Self-pay | Admitting: Family Medicine

## 2024-03-24 VITALS — BP 130/82 | HR 81 | Temp 97.8°F | Ht 61.0 in | Wt 148.2 lb

## 2024-03-24 DIAGNOSIS — F5104 Psychophysiologic insomnia: Secondary | ICD-10-CM | POA: Insufficient documentation

## 2024-03-24 DIAGNOSIS — F411 Generalized anxiety disorder: Secondary | ICD-10-CM | POA: Diagnosis not present

## 2024-03-24 DIAGNOSIS — Z79890 Hormone replacement therapy (postmenopausal): Secondary | ICD-10-CM | POA: Insufficient documentation

## 2024-03-24 NOTE — Progress Notes (Signed)
 Subjective  CC:  Chief Complaint  Patient presents with   Anxiety   Insomnia    Pt stated that she is sleeping better     HPI: Madison Esparza is a 53 y.o. female who presents to the office today to address the problems listed above in the chief complaint. Discussed the use of AI scribe software for clinical note transcription with the patient, who gave verbal consent to proceed.  History of Present Illness Madison Esparza is a 53 year old female who presents with anxiety and sleep disturbances.  Anxiety and stress: Up since starting Paxil  about 6 weeks ago - Has noticed significant improvement in insomnia. - Ongoing anxiety and stress present both during the day and at night - Family interactions, particularly with her mother, exacerbate anxiety and stress - Work serves as a helpful distraction from stress - No recent use of Xanax , but considers carrying it for emergencies - Has noticed sexual side effects as well  Sleep disturbance - Difficulty initiating and maintaining sleep due to anxiety and stress - Improvement in returning to sleep after nighttime awakenings since starting Paxil  - Believes Paxil  has contributed to improved sleep quality  HRT: on testosterone  IM per gyn; increased acne but helps with libido and energy.    Assessment  1. GAD (generalized anxiety disorder)   2. Psychophysiological insomnia   3. Hormone replacement therapy (HRT)      Plan  Assessment and Plan Assessment & Plan Anxiety disorder and insomnia Chronic anxiety and insomnia exacerbated by family stress. Paxil  for 6 weeks has improved sleep but not daytime anxiety. Concerns about potential interaction between Paxil  and alcohol, leading to memory issues. Discussion about potential sexual side effects of Paxil  and its interaction with testosterone  therapy. Consideration of adding progesterone for its calming effects and potential to aid sleep and anxiety. - Continue Paxil  and monitor effects over  the next 6-8 weeks. - Discuss potential addition of oral progesterone with new nurse practitioner and this could be helpful for sleep and anxiety as well. - Monitor sexual side effects.  HRT: - To consider lowering dose of testosterone  or to every 2 week injections.  This could help decrease acne side effects. - Recommend Prometrium per night.    Follow up: As scheduled for complete physical in December No orders of the defined types were placed in this encounter.  No orders of the defined types were placed in this encounter.    I reviewed the patients updated PMH, FH, and SocHx.  Patient Active Problem List   Diagnosis Date Noted   Left-sided trigeminal neuralgia 08/12/2021    Priority: High   Mixed hyperlipidemia 06/30/2018    Priority: High   Diverticulosis 02/27/2024    Priority: Medium    Microcytic anemia 07/05/2018    Priority: Medium    Allergic rhinitis due to pollen 07/25/2020    Priority: Low   Vitamin D deficiency 07/25/2020    Priority: Low   Hiatal hernia 08/10/2019    Priority: Low   Hormone replacement therapy (HRT) 03/24/2024   Psychophysiological insomnia 03/24/2024   GAD (generalized anxiety disorder) 02/11/2024   Family history of premature CAD 08/12/2023   Nummular eczema 08/12/2023   S/P craniotomy 11/07/2022   Current Meds  Medication Sig   ALPRAZolam  (XANAX ) 0.5 MG tablet Take 1 tablet (0.5 mg total) by mouth at bedtime as needed for anxiety or sleep.   dicyclomine  (BENTYL ) 20 MG tablet Take 1 tablet (20 mg total) by mouth  4 (four) times daily -  before meals and at bedtime.   docusate sodium (COLACE) 100 MG capsule Take 100 mg by mouth daily.   fluticasone  (FLONASE ) 50 MCG/ACT nasal spray Place 1 spray into both nostrils daily.   pantoprazole  (PROTONIX ) 40 MG tablet Take 1 tablet (40 mg total) by mouth daily.   PARoxetine  (PAXIL ) 20 MG tablet Take 0.5 tablets (10 mg total) by mouth at bedtime for 7 days, THEN 1 tablet (20 mg total) at bedtime.    testosterone  cypionate (DEPO-TESTOSTERONE ) 100 MG/ML injection 1 mL Intramuscular once; Duration: 90 days   triamcinolone  cream (KENALOG ) 0.1 % Apply 1 Application topically 2 (two) times daily. For 2 weeks, then as needed   Allergies: Patient is allergic to motrin [ibuprofen]. Family History: Patient family history includes Arthritis in her maternal grandmother, mother, and sister; Breast cancer in her cousin; Hearing loss in her maternal grandfather and maternal grandmother; Heart attack in her sister; Heart disease in her brother; Heart disease (age of onset: 9) in her sister; Hyperlipidemia in her mother; Hypertension in her maternal grandmother; Miscarriages / [place] in her mother; Stroke in her sister. Social History:  Patient  reports that she has quit smoking. Her smoking use included cigarettes. She has never used smokeless tobacco. She reports current alcohol use of about 7.0 standard drinks of alcohol per week. She reports that she does not use drugs.  Review of Systems: Constitutional: Negative for fever malaise or anorexia Cardiovascular: negative for chest pain Respiratory: negative for SOB or persistent cough Gastrointestinal: negative for abdominal pain  Objective  Vitals: BP 130/82   Pulse 81   Temp 97.8 F (36.6 C)   Ht 5' 1 (1.549 m)   Wt 148 lb 3.2 oz (67.2 kg)   SpO2 98%   BMI 28.00 kg/m  General: no acute distress , A&Ox3 Psych: Appears happy, well-adjusted [place] side effects, risks, benefits, and alternatives for medications and treatment plan prescribed today were discussed, and the patient expressed understanding of the given instructions. Patient is instructed to call or message via MyChart if he/she has any questions or concerns regarding our treatment plan. No barriers to understanding were identified. We discussed Red Flag symptoms and signs in detail. Patient expressed understanding regarding what to do in case of urgent or emergency type  symptoms.  Medication list was reconciled, printed and provided to the patient in AVS. Patient instructions and summary information was reviewed with the patient as documented in the AVS. This note was prepared with assistance of Dragon voice recognition software. Occasional wrong-word or sound-a-like substitutions may have occurred due to the inherent limitations of voice recognition software

## 2024-03-24 NOTE — Patient Instructions (Signed)
 Please follow up as scheduled for your next visit with me: 05/11/2024   If you have any questions or concerns, please don't hesitate to send me a message via MyChart or call the office at [phone]. Thank you for visiting with us  today! It's our pleasure caring for you.   Please schedule your mammogram

## 2024-05-09 ENCOUNTER — Other Ambulatory Visit: Payer: Self-pay | Admitting: Family Medicine

## 2024-05-09 DIAGNOSIS — Z1231 Encounter for screening mammogram for malignant neoplasm of breast: Secondary | ICD-10-CM

## 2024-05-11 ENCOUNTER — Ambulatory Visit: Admitting: Family Medicine

## 2024-05-11 ENCOUNTER — Encounter: Payer: Self-pay | Admitting: Family Medicine

## 2024-05-11 ENCOUNTER — Other Ambulatory Visit (HOSPITAL_BASED_OUTPATIENT_CLINIC_OR_DEPARTMENT_OTHER): Payer: Self-pay

## 2024-05-11 VITALS — BP 138/84 | HR 81 | Temp 97.9°F | Ht 61.0 in | Wt 148.2 lb

## 2024-05-11 DIAGNOSIS — F411 Generalized anxiety disorder: Secondary | ICD-10-CM

## 2024-05-11 DIAGNOSIS — E559 Vitamin D deficiency, unspecified: Secondary | ICD-10-CM | POA: Diagnosis not present

## 2024-05-11 DIAGNOSIS — Z23 Encounter for immunization: Secondary | ICD-10-CM

## 2024-05-11 DIAGNOSIS — E782 Mixed hyperlipidemia: Secondary | ICD-10-CM | POA: Diagnosis not present

## 2024-05-11 DIAGNOSIS — Z8249 Family history of ischemic heart disease and other diseases of the circulatory system: Secondary | ICD-10-CM | POA: Diagnosis not present

## 2024-05-11 DIAGNOSIS — F5104 Psychophysiologic insomnia: Secondary | ICD-10-CM | POA: Diagnosis not present

## 2024-05-11 DIAGNOSIS — Z79890 Hormone replacement therapy (postmenopausal): Secondary | ICD-10-CM

## 2024-05-11 DIAGNOSIS — Z0001 Encounter for general adult medical examination with abnormal findings: Secondary | ICD-10-CM | POA: Diagnosis not present

## 2024-05-11 DIAGNOSIS — D509 Iron deficiency anemia, unspecified: Secondary | ICD-10-CM | POA: Diagnosis not present

## 2024-05-11 LAB — LIPID PANEL
Cholesterol: 294 mg/dL — ABNORMAL HIGH (ref 0–200)
HDL: 88.1 mg/dL (ref >39.00)
LDL Cholesterol: 173 mg/dL — ABNORMAL HIGH (ref 0–99)
NonHDL: 205.8
Total CHOL/HDL Ratio: 3
Triglycerides: 166 mg/dL — ABNORMAL HIGH (ref 0.0–149.0)
VLDL: 33.2 mg/dL (ref 0.0–40.0)

## 2024-05-11 LAB — CBC WITH DIFFERENTIAL/PLATELET
Basophils Absolute: 0 K/uL (ref 0.0–0.1)
Basophils Relative: 0.5 % (ref 0.0–3.0)
Eosinophils Absolute: 0.1 K/uL (ref 0.0–0.7)
Eosinophils Relative: 1.4 % (ref 0.0–5.0)
HCT: 35 % — ABNORMAL LOW (ref 36.0–46.0)
Hemoglobin: 11.5 g/dL — ABNORMAL LOW (ref 12.0–15.0)
Lymphocytes Relative: 28.9 % (ref 12.0–46.0)
Lymphs Abs: 1.2 K/uL (ref 0.7–4.0)
MCHC: 32.9 g/dL (ref 30.0–36.0)
MCV: 74.1 fl — ABNORMAL LOW (ref 78.0–100.0)
Monocytes Absolute: 0.3 K/uL (ref 0.1–1.0)
Monocytes Relative: 7 % (ref 3.0–12.0)
Neutro Abs: 2.5 K/uL (ref 1.4–7.7)
Neutrophils Relative %: 62.2 % (ref 43.0–77.0)
Platelets: 272 K/uL (ref 150.0–400.0)
RBC: 4.72 Mil/uL (ref 3.87–5.11)
RDW: 16.1 % — ABNORMAL HIGH (ref 11.5–15.5)
WBC: 4 K/uL (ref 4.0–10.5)

## 2024-05-11 LAB — COMPREHENSIVE METABOLIC PANEL WITH GFR
ALT: 19 U/L (ref 0–35)
AST: 27 U/L (ref 0–37)
Albumin: 4.7 g/dL (ref 3.5–5.2)
Alkaline Phosphatase: 70 U/L (ref 39–117)
BUN: 13 mg/dL (ref 6–23)
CO2: 28 meq/L (ref 19–32)
Calcium: 10 mg/dL (ref 8.4–10.5)
Chloride: 100 meq/L (ref 96–112)
Creatinine, Ser: 0.65 mg/dL (ref 0.40–1.20)
GFR: 101.53 mL/min (ref >60.00)
Glucose, Bld: 94 mg/dL (ref 70–99)
Potassium: 4.4 meq/L (ref 3.5–5.1)
Sodium: 137 meq/L (ref 135–145)
Total Bilirubin: 0.6 mg/dL (ref 0.2–1.2)
Total Protein: 7.6 g/dL (ref 6.0–8.3)

## 2024-05-11 LAB — VITAMIN B12: Vitamin B-12: 362 pg/mL (ref 211–911)

## 2024-05-11 LAB — TSH: TSH: 1.57 u[IU]/mL (ref 0.35–5.50)

## 2024-05-11 LAB — VITAMIN D 25 HYDROXY (VIT D DEFICIENCY, FRACTURES): VITD: 14.49 ng/mL — ABNORMAL LOW (ref 30.00–100.00)

## 2024-05-11 MED ORDER — ESTRADIOL 0.05 MG/24HR TD PTWK
0.0500 mg | MEDICATED_PATCH | TRANSDERMAL | 3 refills | Status: DC
  Filled 2024-05-11: qty 12, 84d supply, fill #0

## 2024-05-11 MED ORDER — PROGESTERONE MICRONIZED 100 MG PO CAPS
100.0000 mg | ORAL_CAPSULE | Freq: Every evening | ORAL | 3 refills | Status: AC
  Filled 2024-05-11: qty 90, 90d supply, fill #0

## 2024-05-11 NOTE — Progress Notes (Unsigned)
 Subjective  Chief Complaint  Patient presents with   Annual Exam    Pt here for Annual Exam and is not currently fasting    HPI: Madison Esparza is a 53 y.o. female who presents to [facility] [place] today for a Female Wellness Visit. She also has the concerns and/or needs as listed above in the chief complaint. These will be addressed in addition to the Health Maintenance Visit.   Wellness Visit: annual visit with health maintenance review and exam  HM: screens are current. Wants to get established with new GYN. Imms eligible for shingrix and prevnar 20.   Chronic disease f/u and/or acute problem visit: (deemed necessary to be done in addition to the wellness visit): Discussed the use of AI scribe software for clinical note transcription with the patient, who gave verbal consent to proceed.  History of Present Illness Madison Esparza is a 53 year old female who presents with concerns about mood changes and medication side effects.  Neuropsychiatric symptoms and medication side effects - Significant fatigue and memory impairment since starting Paxil  that started approximately 2-3 weeks ago - Episodes of 'blacking out' after consuming even small amounts of alcohol, not present prior to Paxil  initiation - Mood worsening with increased daytime tiredness and heightened anxiety regarding memory lapses - Partial improvement in symptoms after reducing Paxil  dose to half a pill starting Monday, with slight improvement the following morning - History of similar alcohol-related issues while on Lexapro - Anxiety somewhat managed by Paxil , but side effects are intolerable  Menopausal status and hormonal therapy - No menstrual period for two years, consistent with postmenopausal status - No current hormone replacement therapy - Previous use of testosterone , discontinued due to adverse effects: acne - helps with libido - fatigue, brain fog, weight changes   Cardiovascular  symptoms - Single episode of rapid heart rate while on treadmill, resolved after reducing workout intensity - Drinks one cup of coffee daily and questions if caffeine contributed to the episode - has risk factors for CAD: FH and HLD  Needs f/u for mild anemia noted on previous labs.      Assessment  1. Encounter for well adult exam with abnormal findings   2. Need for shingles vaccine   3. Mixed hyperlipidemia   4. Microcytic anemia   5. Vitamin D deficiency   6. Family history of premature CAD   7. GAD (generalized anxiety disorder)   8. Hormone replacement therapy (HRT)   9. Psychophysiological insomnia      Plan  Female Wellness Visit: Age appropriate Health Maintenance and Prevention measures were discussed with patient. Included topics are cancer screening recommendations, ways to keep healthy (see AVS) including dietary and exercise recommendations, regular eye and dental care, use of seat belts, and avoidance of moderate alcohol use and tobacco use.  BMI: discussed patient's BMI and encouraged positive lifestyle modifications to help get to or maintain a target BMI. HM needs and immunizations were addressed and ordered. See below for orders. See HM and immunization section for updates. Routine labs and screening tests ordered including cmp, cbc and lipids where appropriate. Discussed recommendations regarding Vit D and calcium  supplementation (see AVS)  Chronic disease management visit and/or acute problem visit: Assessment and Plan Assessment & Plan Adult Wellness Visit Routine wellness visit with discussion on general health and lifestyle, including stress management and alcohol consumption. - Encouraged reduction of alcohol consumption, especially during the week.  Menopausal symptoms with planned hormone replacement  therapy Menopausal symptoms include mood swings, anxiety, and sleep disturbances. Hormone replacement therapy planned to address these symptoms. Previous  testosterone  therapy was ineffective and caused adverse effects. No history of breast cancer or strong family history. Normal mammogram results. - Start medium dose estrogen patch once a week. Climar 0.5mg  weekly - Start nightly progesterone pill. Prometrium 100 qhs - Monitor symptoms and adjust hormone therapy as needed. Consider topical testosterone   Depression and generalized anxiety disorder Current medication (Paxil ) causing adverse effects, including enhanced alcohol effects and mood changes. Plan to wean off Paxil  and assess mood and anxiety levels. Hormone therapy may help manage mood and anxiety symptoms. - Wean off Paxil  by taking 10 mg every other day this week, then twice a week next week, and then stop. - Monitor mood and anxiety levels after discontinuation of Paxil . - Will reassess in 6-8 weeks to evaluate mood and anxiety status. - I'm hopeful better sleep and hormone optimization will positively effect anxiety sxs  Insomnia Difficulty sleeping, possibly related to anxiety and hormonal imbalances. Progesterone therapy may help with sleep. - Start nightly progesterone pill to aid sleep. - Recommended magnesium glycinate supplement at night to improve sleep quality.  Recheck cbc and iron levels.  Had nl colonoscopy 01/2024  Encounter for immunization (shingles vaccine) Received shingles vaccine today. Discussed concerns about vaccine side effects, particularly related to previous COVID vaccination. Defers prevnar 20 for now.    Follow up: 6-12 weeks for recheck.   Orders Placed This Encounter  Procedures   Zoster Recombinant (Shingrix )   TSH   VITAMIN D 25 Hydroxy (Vit-D Deficiency, Fractures)   CBC with Differential/Platelet   Comprehensive metabolic panel with GFR   Lipid panel   Vitamin B12   Iron, TIBC and Ferritin Panel   Meds ordered this encounter  Medications   progesterone (PROMETRIUM) 100 MG capsule    Sig: Take 1 capsule (100 mg total) by mouth at  bedtime.    Dispense:  90 capsule    Refill:  3   estradiol (CLIMARA - DOSED IN MG/24 HR) 0.05 mg/24hr patch    Sig: Place 1 patch (0.05 mg total) onto the skin once a week.    Dispense:  12 patch    Refill:  3      Body mass index is 28 kg/m. Wt Readings from Last 3 Encounters:  05/11/24 148 lb 3.2 oz (67.2 kg)  03/24/24 148 lb 3.2 oz (67.2 kg)  02/11/24 151 lb (68.5 kg)     Patient Active Problem List   Diagnosis Date Noted   Left-sided trigeminal neuralgia 08/12/2021    Priority: High   Mixed hyperlipidemia 06/30/2018    Priority: High   Diverticulosis 02/27/2024    Priority: Medium     Colonoscopy 01/2024, Dr. Saintclair Ee GI: nl except for tics and non bleeding internal hemorrhoids. Repeat 10 years    Microcytic anemia 07/05/2018    Priority: Medium    Allergic rhinitis due to pollen 07/25/2020    Priority: Low   Vitamin D deficiency 07/25/2020    Priority: Low   Hiatal hernia 08/10/2019    Priority: Low   Hormone replacement therapy (HRT) 03/24/2024   Psychophysiological insomnia 03/24/2024   GAD (generalized anxiety disorder) 02/11/2024   Family history of premature CAD 08/12/2023    Sister age 64    Nummular eczema 08/12/2023   S/P craniotomy 11/07/2022   Health Maintenance  Topic Date Due   Hepatitis C Screening  Never done  Hepatitis B Vaccines 19-59 Average Risk (1 of 3 - 19+ 3-dose series) Never done   Mammogram  02/18/2024   COVID-19 Vaccine (3 - Pfizer risk series) 05/27/2024 (Originally 12/15/2019)   Influenza Vaccine  09/06/2024 (Originally 01/08/2024)   Pneumococcal Vaccine: 50+ Years (1 of 1 - PCV) 03/24/2025 (Originally 05/12/2022)   Zoster Vaccines- Shingrix (2 of 2) 07/06/2024   Cervical Cancer Screening (HPV/Pap Cotest)  05/18/2028   DTaP/Tdap/Td (2 - Td or Tdap) 06/30/2028   Colonoscopy  01/27/2034   HIV Screening  Completed   HPV VACCINES  Aged Out   Meningococcal B Vaccine  Aged Out   Immunization History  Administered Date(s)  Administered   PFIZER(Purple Top)SARS-COV-2 Vaccination 10/27/2019, 11/17/2019   Tdap 06/30/2018   Zoster Recombinant(Shingrix) 05/11/2024   We updated and reviewed the patient's past history in detail and it is documented below. Allergies: Patient is allergic to motrin [ibuprofen]. Past Medical History Patient  has a past medical history of Allergy, Anemia, Annual physical exam (04/17/2021), Anxiety, Hyperlipidemia, and Intermenstrual bleeding (07/25/2020). Past Surgical History Patient  has a past surgical history that includes Dilation and curettage of uterus; Colonoscopy; and Brain surgery. Family History: Patient family history includes Arthritis in her maternal grandmother, mother, and sister; Breast cancer in her cousin; Hearing loss in her maternal grandfather and maternal grandmother; Heart attack in her sister; Heart disease in her brother; Heart disease (age of onset: 71) in her sister; Hyperlipidemia in her mother; Hypertension in her maternal grandmother; Miscarriages / Stillbirths in her mother; Stroke in her sister. Social History:  Patient  reports that she has quit smoking. Her smoking use included cigarettes. She has never used smokeless tobacco. She reports current alcohol use of about 7.0 standard drinks of alcohol per week. She reports that she does not use drugs.  Review of Systems: Constitutional: negative for fever or malaise Ophthalmic: negative for photophobia, double vision or loss of vision Cardiovascular: negative for chest pain, dyspnea on exertion, or new LE swelling Respiratory: negative for SOB or persistent cough Gastrointestinal: negative for abdominal pain, change in bowel habits or melena Genitourinary: negative for dysuria or gross hematuria, no abnormal uterine bleeding or disharge Musculoskeletal: negative for new gait disturbance or muscular weakness Integumentary: negative for new or persistent rashes, no breast lumps Neurological: negative for TIA or  stroke symptoms Psychiatric: negative for SI or delusions Allergic/Immunologic: negative for hives  Patient Care Team    Relationship Specialty Notifications Start End  Jodie Lavern CROME, MD PCP - General Family Medicine  08/12/23   Tobie Tonita POUR, DO Consulting Physician Neurology  08/12/21   Rosine Yancy ORN Consulting Physician Neurosurgery  08/12/23   Rosalva Sawyer, MD Consulting Physician Obstetrics and Gynecology  08/12/23   Saintclair Jasper, MD Consulting Physician Gastroenterology  08/12/23   Alm Delon SAILOR, DO Consulting Physician Dermatology  11/11/23     Objective  Vitals: BP 138/84   Pulse 81   Temp 97.9 F (36.6 C)   Ht 5' 1 (1.549 m)   Wt 148 lb 3.2 oz (67.2 kg)   SpO2 97%   BMI 28.00 kg/m  General:  Well developed, well nourished, no acute distress  Psych:  Alert and orientedx3,normal mood and affect HEENT:  Normocephalic, atraumatic, non-icteric sclera,  supple neck without adenopathy, mass or thyromegaly Cardiovascular:  Normal S1, S2, RRR without gallop, rub or murmur Respiratory:  Good breath sounds bilaterally, CTAB with normal respiratory effort Gastrointestinal: normal bowel sounds, soft, non-tender, no noted masses. No HSM MSK:  extremities without edema, joints without erythema or swelling Neurologic:    Mental status is normal.  Gross motor and sensory exams are normal.  No tremor  Commons side effects, risks, benefits, and alternatives for medications and treatment plan prescribed today were discussed, and the patient expressed understanding of the given instructions. Patient is instructed to call or message via MyChart if he/she has any questions or concerns regarding our treatment plan. No barriers to understanding were identified. We discussed Red Flag symptoms and signs in detail. Patient expressed understanding regarding what to do in case of urgent or emergency type symptoms.  Medication list was reconciled, printed and provided to the patient in AVS. Patient  instructions and summary information was reviewed with the patient as documented in the AVS. This note was prepared with assistance of Dragon voice recognition software. Occasional wrong-word or sound-a-like substitutions may have occurred due to the inherent limitations of voice recognition software

## 2024-05-12 ENCOUNTER — Ambulatory Visit: Payer: Self-pay | Admitting: Family Medicine

## 2024-05-12 LAB — IRON,TIBC AND FERRITIN PANEL
%SAT: 32 % (ref 16–45)
Ferritin: 112 ng/mL (ref 16–232)
Iron: 99 ug/dL (ref 45–160)
TIBC: 314 ug/dL (ref 250–450)

## 2024-05-12 NOTE — Progress Notes (Signed)
 See mychart note The 10-year ASCVD risk score (Arnett DK, et al., 2019) is: 1.6%   Values used to calculate the score:     Age: 53 years     Clincally relevant sex: Female     Is Non-Hispanic African American: No     Diabetic: No     Tobacco smoker: No     Systolic Blood Pressure: 138 mmHg     Is BP treated: No     HDL Cholesterol: 88.1 mg/dL     Total Cholesterol: 294 mg/dL

## 2024-05-12 NOTE — Patient Instructions (Signed)
 Please return in 6-12 weeks for recheck.   I will release your lab results to you on your MyChart account with further instructions. You may see the results before I do, but when I review them I will send you a message with my report or have my assistant call you if things need to be discussed. Please reply to my message with any questions. Thank you!   If you have any questions or concerns, please don't hesitate to send me a message via MyChart or call the office at [phone]. Thank you for visiting with us  today! It's our pleasure caring for you.    VISIT SUMMARY: Today, we discussed your concerns about mood changes, medication side effects, and general health. We reviewed your symptoms since starting Paxil , your alcohol use, menopausal status, cardiovascular symptoms, and recent vaccine reactions. We have made adjustments to your medication and discussed a plan to address your symptoms.  YOUR PLAN: -MENOPAUSAL SYMPTOMS: Menopausal symptoms can include mood swings, anxiety, and sleep disturbances due to hormonal changes. We will start you on a medium dose estrogen patch once a week and a nightly progesterone pill to help manage these symptoms. We will monitor your symptoms and adjust the hormone therapy as needed.  -DEPRESSION AND GENERALIZED ANXIETY DISORDER: Depression and generalized anxiety disorder involve persistent feelings of sadness and worry. Since Paxil  is causing adverse effects, we will wean you off it by taking 10 mg every other day this week, then twice a week next week, and then stop. We will monitor your mood and anxiety levels after discontinuation and reassess in 6-8 weeks.  -INSOMNIA: Insomnia is difficulty sleeping, which can be related to anxiety and hormonal imbalances. We will start you on a nightly progesterone pill and recommend a magnesium glycinate supplement at night to improve sleep quality.  -GENERAL HEALTH MAINTENANCE: We discussed general health and lifestyle,  including stress management and reducing alcohol consumption, especially during the week. Your recent mammogram results were normal, and you received the shingles vaccine today. We also addressed your concerns about vaccine side effects.  INSTRUCTIONS: Please follow the plan to wean off Paxil  as discussed: take 10 mg every other day this week, then twice a week next week, and then stop. Start using the medium dose estrogen patch once a week and take the nightly progesterone pill. Additionally, take a magnesium glycinate supplement at night to help with sleep. We will reassess your mood and anxiety levels in 6-8 weeks. Monitor your symptoms and let us  know if you experience any issues or side effects.                      Contains text generated by Abridge.                                 Contains text generated by Abridge.

## 2024-05-16 ENCOUNTER — Other Ambulatory Visit (HOSPITAL_BASED_OUTPATIENT_CLINIC_OR_DEPARTMENT_OTHER): Payer: Self-pay

## 2024-06-08 ENCOUNTER — Ambulatory Visit
Admission: RE | Admit: 2024-06-08 | Discharge: 2024-06-08 | Disposition: A | Discharge status: Home / Self Care | Source: Ambulatory Visit | Attending: Family Medicine | Admitting: Family Medicine

## 2024-06-08 DIAGNOSIS — Z1231 Encounter for screening mammogram for malignant neoplasm of breast: Secondary | ICD-10-CM

## 2024-06-22 ENCOUNTER — Ambulatory Visit: Admitting: Family Medicine

## 2024-07-06 ENCOUNTER — Other Ambulatory Visit (HOSPITAL_BASED_OUTPATIENT_CLINIC_OR_DEPARTMENT_OTHER): Payer: Self-pay

## 2024-07-06 ENCOUNTER — Encounter: Payer: Self-pay | Admitting: Family Medicine

## 2024-07-06 ENCOUNTER — Ambulatory Visit (INDEPENDENT_AMBULATORY_CARE_PROVIDER_SITE_OTHER): Admitting: Family Medicine

## 2024-07-06 VITALS — BP 134/86 | HR 84 | Temp 97.7°F | Ht 61.0 in | Wt 156.2 lb

## 2024-07-06 DIAGNOSIS — E663 Overweight: Secondary | ICD-10-CM

## 2024-07-06 DIAGNOSIS — E782 Mixed hyperlipidemia: Secondary | ICD-10-CM

## 2024-07-06 DIAGNOSIS — E559 Vitamin D deficiency, unspecified: Secondary | ICD-10-CM

## 2024-07-06 DIAGNOSIS — Z79890 Hormone replacement therapy (postmenopausal): Secondary | ICD-10-CM | POA: Diagnosis not present

## 2024-07-06 DIAGNOSIS — F411 Generalized anxiety disorder: Secondary | ICD-10-CM

## 2024-07-06 DIAGNOSIS — E538 Deficiency of other specified B group vitamins: Secondary | ICD-10-CM | POA: Diagnosis not present

## 2024-07-06 MED ORDER — ESTRADIOL 0.0375 MG/24HR TD PTWK
0.0375 mg | MEDICATED_PATCH | TRANSDERMAL | 12 refills | Status: AC
  Filled 2024-07-06: qty 4, 28d supply, fill #0

## 2024-07-06 NOTE — Progress Notes (Signed)
 "  Subjective  CC:  Chief Complaint  Patient presents with   follow up labs    HPI: Madison Esparza is a 54 y.o. female who presents to the office today to address the problems listed above in the chief complaint. Discussed the use of AI scribe software for clinical note transcription with the patient, who gave verbal consent to proceed.  History of Present Illness Madison Esparza is a 54 year old female who presents for follow-up on hormone therapy and sleep issues.  Hormone therapy management - Discontinued high-dose testosterone  and has not resumed therapy - Experiencing issues with hormone patches not adhering properly despite trying different application sites - Currently using progesterone , which has improved sleep and mood, resulting in a calmer affect - Increased irritability attributed to caffeine intake and possible estrogen - Sleep quality generally good now with progesterone  and magnesium - Recent early awakening at 4:30 AM due to work-related stress  GAD - Discontinued Paxil  and feels well without it - Improved mood attributed to progesterone  - Monitoring for irritability, particularly during family interactions, but overall back to feeling well  Vitamin deficiencies - Recent laboratory results show low B12 and very low vitamin D  - Currently taking a melting vitamin D  supplement and sublingual B12 every morning  HLD: elevated LDL on recent testing; maybe related to testosterone  supplements; no longer on injections. Eating better ... Trying.  Would like to lose weight.   Assessment  1. Hormone replacement therapy (HRT)   2. Vitamin D  deficiency   3. Vitamin B12 deficiency   4. GAD (generalized anxiety disorder)   5. Mixed hyperlipidemia   6. Overweight (BMI 25.0-29.9)      Plan  Assessment and Plan Assessment & Plan Menopausal hormone therapy management Currently on low-dose estrogen patch and progesterone . Reports improved sleep with progesterone  but experiences  irritability, possibly due to estrogen levels. Cuts estrogen patch in half due to high dose. No recent estrogen level check. - Reduced estrogen patch dose to 0.375 patch weekly and monitor sxs - progesterone  is helpful 100 qhs - Continue progesterone  at current dose  HLD- work on diet and recheck in 6 months.   GAD: now controlled. With HRT. No antidepressants needed.  Vitamin D  deficiency Vitamin D  levels are very low. Currently taking vitamin D3 supplements. 5000 units daily - Continue vitamin D3 supplementation  Vitamin B12 deficiency Vitamin B12 levels are slightly low. Currently taking B12 sublingual supplements. - Continue B12 sublingual supplementation    Follow up: for 2nd shingrix  No orders of the defined types were placed in this encounter.  Meds ordered this encounter  Medications   estradiol  (CLIMARA  - DOSED IN MG/24 HR) 0.0375 mg/24hr patch    Sig: Place 1 patch (0.0375 mg total) onto the skin once a week.    Dispense:  4 patch    Refill:  12     I reviewed the patients updated PMH, FH, and SocHx.  Patient Active Problem List   Diagnosis Date Noted   Hormone replacement therapy (HRT) 03/24/2024    Priority: High   GAD (generalized anxiety disorder) 02/11/2024    Priority: High   Family history of premature CAD 08/12/2023    Priority: High   Left-sided trigeminal neuralgia 08/12/2021    Priority: High   Mixed hyperlipidemia 06/30/2018    Priority: High   Psychophysiological insomnia 03/24/2024    Priority: Medium    Diverticulosis 02/27/2024    Priority: Medium    S/P craniotomy 11/07/2022  Priority: Medium    Microcytic anemia 07/05/2018    Priority: Medium    Nummular eczema 08/12/2023    Priority: Low   Allergic rhinitis due to pollen 07/25/2020    Priority: Low   Vitamin D  deficiency 07/25/2020    Priority: Low   Hiatal hernia 08/10/2019    Priority: Low   Active Medications[1] Allergies: Patient is allergic to motrin  [ibuprofen]. Family History: Patient family history includes Arthritis in her maternal grandmother, mother, and sister; Breast cancer in her cousin; Hearing loss in her maternal grandfather and maternal grandmother; Heart attack in her sister; Heart disease in her brother; Heart disease (age of onset: 44) in her sister; Hyperlipidemia in her mother; Hypertension in her maternal grandmother; Miscarriages / Stillbirths in her mother; Stroke in her sister. Social History:  Patient  reports that she has quit smoking. Her smoking use included cigarettes. She has never used smokeless tobacco. She reports current alcohol use of about 7.0 standard drinks of alcohol per week. She reports that she does not use drugs.  Review of Systems: Constitutional: Negative for fever malaise or anorexia Cardiovascular: negative for chest pain Respiratory: negative for SOB or persistent cough Gastrointestinal: negative for abdominal pain  Objective  Vitals: BP 134/86   Pulse 84   Temp 97.7 F (36.5 C)   Ht 5' 1 (1.549 m)   Wt 156 lb 3.2 oz (70.9 kg)   SpO2 97%   BMI 29.51 kg/m  General: no acute distress , A&Ox3 HEENT: PEERL, conjunctiva normal, neck is supple Cardiovascular:  RRR without murmur or gallop.  Respiratory:  Good breath sounds bilaterally, CTAB with normal respiratory effort Skin:  Warm, no rashes Commons side effects, risks, benefits, and alternatives for medications and treatment plan prescribed today were discussed, and the patient expressed understanding of the given instructions. Patient is instructed to call or message via MyChart if he/she has any questions or concerns regarding our treatment plan. No barriers to understanding were identified. We discussed Red Flag symptoms and signs in detail. Patient expressed understanding regarding what to do in case of urgent or emergency type symptoms.  Medication list was reconciled, printed and provided to the patient in AVS. Patient instructions and  summary information was reviewed with the patient as documented in the AVS. This note was prepared with assistance of Dragon voice recognition software. Occasional wrong-word or sound-a-like substitutions may have occurred due to the inherent limitations of voice recognition software    [1]  Current Meds  Medication Sig   ALPRAZolam  (XANAX ) 0.5 MG tablet Take 1 tablet (0.5 mg total) by mouth at bedtime as needed for anxiety or sleep.   dicyclomine  (BENTYL ) 20 MG tablet Take 1 tablet (20 mg total) by mouth 4 (four) times daily -  before meals and at bedtime.   docusate sodium (COLACE) 100 MG capsule Take 100 mg by mouth daily.   estradiol  (CLIMARA  - DOSED IN MG/24 HR) 0.0375 mg/24hr patch Place 1 patch (0.0375 mg total) onto the skin once a week.   fluticasone  (FLONASE ) 50 MCG/ACT nasal spray Place 1 spray into both nostrils daily.   pantoprazole  (PROTONIX ) 40 MG tablet Take 1 tablet (40 mg total) by mouth daily.   progesterone  (PROMETRIUM ) 100 MG capsule Take 1 capsule (100 mg total) by mouth at bedtime.   triamcinolone  cream (KENALOG ) 0.1 % Apply 1 Application topically 2 (two) times daily. For 2 weeks, then as needed   [DISCONTINUED] estradiol  (CLIMARA  - DOSED IN MG/24 HR) 0.05 mg/24hr patch Place  1 patch (0.05 mg total) onto the skin once a week.   "

## 2024-07-07 ENCOUNTER — Other Ambulatory Visit (HOSPITAL_BASED_OUTPATIENT_CLINIC_OR_DEPARTMENT_OTHER): Payer: Self-pay

## 2024-07-15 ENCOUNTER — Other Ambulatory Visit (HOSPITAL_BASED_OUTPATIENT_CLINIC_OR_DEPARTMENT_OTHER): Payer: Self-pay

## 2024-07-20 ENCOUNTER — Ambulatory Visit

## 2024-11-09 ENCOUNTER — Ambulatory Visit: Admitting: Dermatology
